# Patient Record
Sex: Male | Born: 1940 | Hispanic: No | Marital: Married | Smoking: Former smoker
Health system: Southern US, Community
[De-identification: ages and names within clinical notes are randomized; demographics above are authoritative.]

## PROBLEM LIST (undated history)

## (undated) DIAGNOSIS — E7800 Pure hypercholesterolemia, unspecified: Secondary | ICD-10-CM

## (undated) DIAGNOSIS — I10 Essential (primary) hypertension: Secondary | ICD-10-CM

## (undated) DIAGNOSIS — I999 Unspecified disorder of circulatory system: Secondary | ICD-10-CM

## (undated) HISTORY — PX: COLONOSCOPY: SHX174

## (undated) HISTORY — DX: Unspecified disorder of circulatory system: I99.9

## (undated) HISTORY — PX: TOOTH EXTRACTION: SUR596

---

## 2006-10-27 ENCOUNTER — Ambulatory Visit: Payer: Self-pay | Admitting: Internal Medicine

## 2006-12-06 ENCOUNTER — Ambulatory Visit: Payer: Self-pay | Admitting: Internal Medicine

## 2011-12-04 ENCOUNTER — Encounter: Payer: Self-pay | Admitting: Internal Medicine

## 2012-08-15 ENCOUNTER — Encounter: Payer: Self-pay | Admitting: Internal Medicine

## 2014-08-27 ENCOUNTER — Encounter: Payer: Self-pay | Admitting: Physician Assistant

## 2014-08-27 ENCOUNTER — Ambulatory Visit (INDEPENDENT_AMBULATORY_CARE_PROVIDER_SITE_OTHER): Payer: Medicare Other | Admitting: Physician Assistant

## 2014-08-27 VITALS — BP 172/67 | HR 59 | Temp 98.5°F | Resp 16 | Ht 62.5 in | Wt 132.2 lb

## 2014-08-27 DIAGNOSIS — I10 Essential (primary) hypertension: Secondary | ICD-10-CM | POA: Insufficient documentation

## 2014-08-27 MED ORDER — LISINOPRIL 10 MG PO TABS: 10.0000 mg | ORAL_TABLET | Freq: Every day | ORAL | Status: DC

## 2014-08-27 NOTE — Patient Instructions (Signed)
Please return in 2 weeks for follow-up and Annual exam with fasting labs.  We can help finish getting you the immunizations you are due for at that time.  Please take lisinopril as directed.  Headaches should have improved.  Follow-up in 2 weeks.  DASH Eating Plan DASH stands for "Dietary Approaches to Stop Hypertension." The DASH eating plan is a healthy eating plan that has been shown to reduce high blood pressure (hypertension). Additional health benefits may include reducing the risk of type 2 diabetes mellitus, heart disease, and stroke. The DASH eating plan may also help with weight loss. WHAT DO I NEED TO KNOW ABOUT THE DASH EATING PLAN? For the DASH eating plan, you will follow these general guidelines:  Choose foods with a percent daily value for sodium of less than 5% (as listed on the food label).  Use salt-free seasonings or herbs instead of table salt or sea salt.  Check with your health care provider or pharmacist before using salt substitutes.  Eat lower-sodium products, often labeled as "lower sodium" or "no salt added."  Eat fresh foods.  Eat more vegetables, fruits, and low-fat dairy products.  Choose whole grains. Look for the word "whole" as the first word in the ingredient list.  Choose fish and skinless chicken or [place] more often than red meat. Limit fish, poultry, and meat to 6 oz (170 g) each day.  Limit sweets, desserts, sugars, and sugary drinks.  Choose heart-healthy fats.  Limit cheese to 1 oz (28 g) per day.  Eat more home-cooked food and less restaurant, buffet, and fast food.  Limit fried foods.  Cook foods using methods other than frying.  Limit canned vegetables. If you do use them, rinse them well to decrease the sodium.  When eating at a restaurant, ask that your food be prepared with less salt, or no salt if possible. WHAT FOODS CAN I EAT? Seek help from a dietitian for individual calorie needs. Grains Whole grain or whole wheat bread.  Brown rice. Whole grain or whole wheat pasta. Quinoa, bulgur, and whole grain cereals. Low-sodium cereals. Corn or whole wheat flour tortillas. Whole grain cornbread. Whole grain crackers. Low-sodium crackers. Vegetables Fresh or frozen vegetables (raw, steamed, roasted, or grilled). Low-sodium or reduced-sodium tomato and vegetable juices. Low-sodium or reduced-sodium tomato sauce and paste. Low-sodium or reduced-sodium canned vegetables.  Fruits All fresh, canned (in natural juice), or frozen fruits. Meat and Other Protein Products Ground beef (85% or leaner), grass-fed beef, or beef trimmed of fat. Skinless chicken or [place]. Ground chicken or [place]. Pork trimmed of fat. All fish and seafood. Eggs. Dried beans, peas, or lentils. Unsalted nuts and seeds. Unsalted canned beans. Dairy Low-fat dairy products, such as skim or 1% milk, 2% or reduced-fat cheeses, low-fat ricotta or cottage cheese, or plain low-fat yogurt. Low-sodium or reduced-sodium cheeses. Fats and Oils Tub margarines without trans fats. Light or reduced-fat mayonnaise and salad dressings (reduced sodium). Avocado. Safflower, olive, or canola oils. Natural peanut or almond butter. Other Unsalted popcorn and pretzels. The items listed above may not be a complete list of recommended foods or beverages. Contact your dietitian for more options. WHAT FOODS ARE NOT RECOMMENDED? Grains White bread. White pasta. White rice. Refined cornbread. Bagels and croissants. Crackers that contain trans fat. Vegetables Creamed or fried vegetables. Vegetables in a cheese sauce. Regular canned vegetables. Regular canned tomato sauce and paste. Regular tomato and vegetable juices. Fruits Dried fruits. Canned fruit in light or heavy syrup. Fruit juice. Meat and  Other Protein Products Fatty cuts of meat. Ribs, chicken wings, bacon, sausage, bologna, salami, chitterlings, fatback, hot dogs, bratwurst, and packaged luncheon meats. Salted nuts and seeds.  Canned beans with salt. Dairy Whole or 2% milk, cream, half-and-half, and cream cheese. Whole-fat or sweetened yogurt. Full-fat cheeses or blue cheese. Nondairy creamers and whipped toppings. Processed cheese, cheese spreads, or cheese curds. Condiments Onion and garlic salt, seasoned salt, table salt, and sea salt. Canned and packaged gravies. Worcestershire sauce. Tartar sauce. Barbecue sauce. Teriyaki sauce. Soy sauce, including reduced sodium. Steak sauce. Fish sauce. Oyster sauce. Cocktail sauce. Horseradish. Ketchup and mustard. Meat flavorings and tenderizers. Bouillon cubes. Hot sauce. Tabasco sauce. Marinades. Taco seasonings. Relishes. Fats and Oils Butter, stick margarine, lard, shortening, ghee, and bacon fat. Coconut, palm kernel, or palm oils. Regular salad dressings. Other Pickles and olives. Salted popcorn and pretzels. The items listed above may not be a complete list of foods and beverages to avoid. Contact your dietitian for more information. WHERE CAN I FIND MORE INFORMATION? National Heart, Lung, and Blood Institute: [employer] Document Released: 10/15/2011 Document Revised: 03/12/2014 Document Reviewed: 08/30/2013 [facility] Patient Information 2015 [place], [place]. This information is not intended to replace advice given to you by your health care provider. Make sure you discuss any questions you have with your health care provider.  Hypertension Hypertension, commonly called high blood pressure, is when the force of blood pumping through your arteries is too strong. Your arteries are the blood vessels that carry blood from your heart throughout your body. A blood pressure reading consists of a higher number over a lower number, such as 110/72. The higher number (systolic) is the pressure inside your arteries when your heart pumps. The lower number (diastolic) is the pressure inside your arteries when your heart relaxes. Ideally you want  your blood pressure below 120/80. Hypertension forces your heart to work harder to pump blood. Your arteries may become narrow or stiff. Having hypertension puts you at risk for heart disease, stroke, and other problems.  RISK FACTORS Some risk factors for high blood pressure are controllable. Others are not.  Risk factors you cannot control include:   Race. You may be at higher risk if you are African American.  Age. Risk increases with age.  Gender. Men are at higher risk than women before age 104 years. After age 62, women are at higher risk than men. Risk factors you can control include:  Not getting enough exercise or physical activity.  Being overweight.  Getting too much fat, sugar, calories, or salt in your diet.  Drinking too much alcohol. SIGNS AND SYMPTOMS Hypertension does not usually cause signs or symptoms. Extremely high blood pressure (hypertensive crisis) may cause headache, anxiety, shortness of breath, and nosebleed. DIAGNOSIS  To check if you have hypertension, your health care provider will measure your blood pressure while you are seated, with your arm held at the level of your heart. It should be measured at least twice using the same arm. Certain conditions can cause a difference in blood pressure between your right and left arms. A blood pressure reading that is higher than normal on one occasion does not mean that you need treatment. If one blood pressure reading is high, ask your health care provider about having it checked again. TREATMENT  Treating high blood pressure includes making lifestyle changes and possibly taking medicine. Living a healthy lifestyle can help lower high blood pressure. You may need to change some of your habits. Lifestyle changes may include:  Following the DASH diet. This diet is high in fruits, vegetables, and whole grains. It is low in salt, red meat, and added sugars.  Getting at least 2 hours of brisk physical activity every  week.  Losing weight if necessary.  Not smoking.  Limiting alcoholic beverages.  Learning ways to reduce stress. If lifestyle changes are not enough to get your blood pressure under control, your health care provider may prescribe medicine. You may need to take more than one. Work closely with your health care provider to understand the risks and benefits. HOME CARE INSTRUCTIONS  Have your blood pressure rechecked as directed by your health care provider.   Take medicines only as directed by your health care provider. Follow the directions carefully. Blood pressure medicines must be taken as prescribed. The medicine does not work as well when you skip doses. Skipping doses also puts you at risk for problems.   Do not smoke.   Monitor your blood pressure at home as directed by your health care provider. SEEK MEDICAL CARE IF:   You think you are having a reaction to medicines taken.  You have recurrent headaches or feel dizzy.  You have swelling in your ankles.  You have trouble with your vision. SEEK IMMEDIATE MEDICAL CARE IF:  You develop a severe headache or confusion.  You have unusual weakness, numbness, or feel faint.  You have severe chest or abdominal pain.  You vomit repeatedly.  You have trouble breathing. MAKE SURE YOU:   Understand these instructions.  Will watch your condition.  Will get help right away if you are not doing well or get worse. Document Released: 10/26/2005 Document Revised: 03/12/2014 Document Reviewed: 08/18/2013 [facility] Patient Information 2015 [place], [place]. This information is not intended to replace advice given to you by your health care provider. Make sure you discuss any questions you have with your health care provider.

## 2014-08-27 NOTE — Assessment & Plan Note (Signed)
New onset per patient.  EKG reveals sinus bradycardia.  Asymptomatic at present. Rx Lisinopril 10 mg. DASH handout given.  Follow-up in 2 weeks for BP recheck and CPE.

## 2014-08-27 NOTE — Progress Notes (Signed)
Pre visit review using our clinic review tool, if applicable. No additional management support is needed unless otherwise documented below in the visit note/SLS  

## 2014-08-27 NOTE — Progress Notes (Signed)
Patient presents to clinic today with translator to establish care.    Acute Concerns: Patient endorses intermittent headaches and some mild fatigue over the past 1-2 months.  Denies history of hypertension but BP elevated at 172/67 in clinic.  Denies chest pain, palpitations, vision change or nose bleeds.  Denies history of MI, CVA, CAD, HLD or DM.  States he is seen yearly by a doctor.  Chronic Issues: Denies significant history of HTN.  Health Maintenance: Dental -- Edentulous. Vision -- up-to-date.  Was told he has cataracts. Immunizations -- Influenza shot given today. Colonoscopy -- Last in 2010.  Endorses was normal.  Past Medical History  Diagnosis Date  . Vascular abnormality     Treated    Past Surgical History  Procedure Laterality Date  . Tooth extraction      Full Dentures    No current outpatient prescriptions on file prior to visit.   No current facility-administered medications on file prior to visit.    No Known Allergies  Family History  Problem Relation Age of Onset  . Healthy Mother     Living  . Healthy Father     Living  . Healthy Brother     x5  . Healthy Sister     x1  . Healthy Son     x2  . Healthy Daughter     x1    History   Social History  . Marital Status: Married    Spouse Name: N/A    Number of Children: N/A  . Years of Education: N/A   Occupational History  . Not on file.   Social History Main Topics  . Smoking status: Former Games developermoker  . Smokeless tobacco: Never Used     Comment: Quit >10 yrs ago  . Alcohol Use: Yes     Comment: rare  . Drug Use: No  . Sexual Activity: No   Other Topics Concern  . Not on file   Social History Narrative  . No narrative on file   Review of Systems  Constitutional: Negative for fever and weight loss.  HENT: Negative for ear discharge, ear pain, hearing loss and tinnitus.   Eyes: Negative for blurred vision, double vision, photophobia and pain.  Respiratory: Negative for  cough and shortness of breath.   Cardiovascular: Negative for chest pain and palpitations.  Gastrointestinal: Negative for heartburn, nausea, vomiting, abdominal pain, diarrhea, constipation, blood in stool and melena.  Genitourinary: Negative for dysuria, urgency, frequency, hematuria and flank pain.  Neurological: Positive for headaches. Negative for dizziness and loss of consciousness.  Endo/Heme/Allergies: Negative for environmental allergies.  Psychiatric/Behavioral: Negative for depression, suicidal ideas, hallucinations and substance abuse. The patient is not nervous/anxious and does not have insomnia.    BP 172/67  Pulse 59  Temp(Src) 98.5 F (36.9 C) (Oral)  Resp 16  Ht 5' 2.5" (1.588 m)  Wt 132 lb 4 oz (59.988 kg)  BMI 23.79 kg/m2  SpO2 98%  Physical Exam  Vitals reviewed. Constitutional: He is oriented to person, place, and time and well-developed, well-nourished, and in no distress.  HENT:  Head: Normocephalic and atraumatic.  Right Ear: External ear normal.  Left Ear: External ear normal.  Nose: Nose normal.  Mouth/Throat: Oropharynx is clear and moist. No oropharyngeal exudate.  TM within normal limits bilaterally.  Eyes: Conjunctivae are normal. Pupils are equal, round, and reactive to light.  Neck: Neck supple.  Cardiovascular: Normal rate, regular rhythm, normal heart sounds and intact distal pulses.  Pulmonary/Chest: Effort normal and breath sounds normal. No respiratory distress. He has no wheezes. He has no rales. He exhibits no tenderness.  Lymphadenopathy:    He has no cervical adenopathy.  Neurological: He is alert and oriented to person, place, and time.  Skin: Skin is warm and dry. No rash noted.  Psychiatric: Affect normal.   Assessment/Plan: Essential hypertension New onset per patient.  EKG reveals sinus bradycardia.  Asymptomatic at present. Rx Lisinopril 10 mg. DASH handout given.  Follow-up in 2 weeks for BP recheck and CPE.

## 2014-09-10 ENCOUNTER — Encounter: Payer: Self-pay | Admitting: Physician Assistant

## 2014-09-10 ENCOUNTER — Ambulatory Visit (INDEPENDENT_AMBULATORY_CARE_PROVIDER_SITE_OTHER): Payer: Medicare Other | Admitting: Physician Assistant

## 2014-09-10 VITALS — BP 155/63 | HR 61 | Temp 98.0°F | Resp 16 | Ht 62.5 in | Wt 133.4 lb

## 2014-09-10 DIAGNOSIS — I10 Essential (primary) hypertension: Secondary | ICD-10-CM

## 2014-09-10 DIAGNOSIS — Z0000 Encounter for general adult medical examination without abnormal findings: Secondary | ICD-10-CM

## 2014-09-10 DIAGNOSIS — Z125 Encounter for screening for malignant neoplasm of prostate: Secondary | ICD-10-CM

## 2014-09-10 DIAGNOSIS — J208 Acute bronchitis due to other specified organisms: Secondary | ICD-10-CM

## 2014-09-10 DIAGNOSIS — B9689 Other specified bacterial agents as the cause of diseases classified elsewhere: Secondary | ICD-10-CM

## 2014-09-10 DIAGNOSIS — J00 Acute nasopharyngitis [common cold]: Secondary | ICD-10-CM

## 2014-09-10 DIAGNOSIS — E785 Hyperlipidemia, unspecified: Secondary | ICD-10-CM

## 2014-09-10 DIAGNOSIS — H269 Unspecified cataract: Secondary | ICD-10-CM

## 2014-09-10 LAB — LIPID PANEL
CHOLESTEROL: 207 mg/dL — AB (ref 0–200)
HDL: 43.7 mg/dL (ref >39.00)
LDL Cholesterol: 134 mg/dL — ABNORMAL HIGH (ref 0–99)
NonHDL: 163.3
Total CHOL/HDL Ratio: 5
Triglycerides: 145 mg/dL (ref 0.0–149.0)
VLDL: 29 mg/dL (ref 0.0–40.0)

## 2014-09-10 LAB — BASIC METABOLIC PANEL
BUN: 15 mg/dL (ref 6–23)
CHLORIDE: 104 meq/L (ref 96–112)
CO2: 24 meq/L (ref 19–32)
CREATININE: 1 mg/dL (ref 0.4–1.5)
Calcium: 9.3 mg/dL (ref 8.4–10.5)
GFR: 76.94 mL/min (ref >60.00)
GLUCOSE: 93 mg/dL (ref 70–99)
Potassium: 3.9 mEq/L (ref 3.5–5.1)
Sodium: 137 mEq/L (ref 135–145)

## 2014-09-10 LAB — HEPATIC FUNCTION PANEL
ALT: 18 U/L (ref 0–53)
AST: 26 U/L (ref 0–37)
Albumin: 3.9 g/dL (ref 3.5–5.2)
Alkaline Phosphatase: 45 U/L (ref 39–117)
Bilirubin, Direct: 0.2 mg/dL (ref 0.0–0.3)
TOTAL PROTEIN: 7.6 g/dL (ref 6.0–8.3)
Total Bilirubin: 0.8 mg/dL (ref 0.2–1.2)

## 2014-09-10 LAB — PSA: PSA: 1.66 ng/mL (ref 0.10–4.00)

## 2014-09-10 MED ORDER — AZITHROMYCIN 250 MG PO TABS: ORAL_TABLET | ORAL | Status: DC

## 2014-09-10 MED ORDER — LOSARTAN POTASSIUM 100 MG PO TABS: 100.0000 mg | ORAL_TABLET | Freq: Every day | ORAL | Status: DC

## 2014-09-10 NOTE — Patient Instructions (Signed)
For the cough, please take antibiotic as directed.  Increase your fluid intake.  Rest.  Use Delsym for cough.  Call or return to clinic if symptoms are not improving.  For blood pressure, giving the cough I am switching your medication to losartan 100 mg daily.  Take this as directed.  Follow-up in 2 weeks for a BP check.  Please stop by lab for blood work.  I will call you with your results.  We will begin medications if indicated by results.  Acute Bronchitis Bronchitis is inflammation of the airways that extend from the windpipe into the lungs (bronchi). The inflammation often causes mucus to develop. This leads to a cough, which is the most common symptom of bronchitis.  In acute bronchitis, the condition usually develops suddenly and goes away over time, usually in a couple weeks. Smoking, allergies, and asthma can make bronchitis worse. Repeated episodes of bronchitis may cause further lung problems.  CAUSES Acute bronchitis is most often caused by the same virus that causes a cold. The virus can spread from person to person (contagious) through coughing, sneezing, and touching contaminated objects. SIGNS AND SYMPTOMS   Cough.   Fever.   Coughing up mucus.   Body aches.   Chest congestion.   Chills.   Shortness of breath.   Sore throat.  DIAGNOSIS  Acute bronchitis is usually diagnosed through a physical exam. Your health care provider will also ask you questions about your medical history. Tests, such as chest X-rays, are sometimes done to rule out other conditions.  TREATMENT  Acute bronchitis usually goes away in a couple weeks. Oftentimes, no medical treatment is necessary. Medicines are sometimes given for relief of fever or cough. Antibiotic medicines are usually not needed but may be prescribed in certain situations. In some cases, an inhaler may be recommended to help reduce shortness of breath and control the cough. A cool mist vaporizer may also be used to help  thin bronchial secretions and make it easier to clear the chest.  HOME CARE INSTRUCTIONS  Get plenty of rest.   Drink enough fluids to keep your urine clear or pale yellow (unless you have a medical condition that requires fluid restriction). Increasing fluids may help thin your respiratory secretions (sputum) and reduce chest congestion, and it will prevent dehydration.   Take medicines only as directed by your health care provider.  If you were prescribed an antibiotic medicine, finish it all even if you start to feel better.  Avoid smoking and secondhand smoke. Exposure to cigarette smoke or irritating chemicals will make bronchitis worse. If you are a smoker, consider using nicotine gum or skin patches to help control withdrawal symptoms. Quitting smoking will help your lungs heal faster.   Reduce the chances of another bout of acute bronchitis by washing your hands frequently, avoiding people with cold symptoms, and trying not to touch your hands to your mouth, nose, or eyes.   Keep all follow-up visits as directed by your health care provider.  SEEK MEDICAL CARE IF: Your symptoms do not improve after 1 week of treatment.  SEEK IMMEDIATE MEDICAL CARE IF:  You develop an increased fever or chills.   You have chest pain.   You have severe shortness of breath.  You have bloody sputum.   You develop dehydration.  You faint or repeatedly feel like you are going to pass out.  You develop repeated vomiting.  You develop a severe headache. MAKE SURE YOU:   Understand these instructions.  Will watch your condition.  Will get help right away if you are not doing well or get worse. Document Released: 12/03/2004 Document Revised: 03/12/2014 Document Reviewed: 04/18/2013 [facility] Patient Information 2015 [place], [place]. This information is not intended to replace advice given to you by your health care provider. Make sure you discuss any questions you have with your health  care provider.

## 2014-09-10 NOTE — Progress Notes (Signed)
Pre visit review using our clinic review tool, if applicable. No additional management support is needed unless otherwise documented below in the visit note/SLS  

## 2014-09-10 NOTE — Progress Notes (Signed)
Subjective:    Mariel AloeLuong Kleckner is a 73 y.o. male who presents for Medicare Annual/Subsequent preventive examination.   Preventive Screening-Counseling & Management  Tobacco History  Smoking status  . Former Smoker  Smokeless tobacco  . Never Used    Comment: Quit >10 yrs ago    Current Problems (verified) Patient Active Problem List   Diagnosis Date Noted  . Cataracts, bilateral 09/11/2014  . Routine history and physical examination of adult 09/11/2014  . Screening for prostate cancer 09/11/2014  . Screening for ischemic heart disease 09/11/2014  . Acute bacterial bronchitis 09/11/2014  . Essential hypertension 08/27/2014    Medications Prior to Visit Current Outpatient Prescriptions on File Prior to Visit  Medication Sig Dispense Refill  . Multiple Vitamin (MULTIVITAMIN) tablet Take 1 tablet by mouth daily.    Marland Kitchen. OVER THE COUNTER MEDICATION as needed. Cough Medication     No current facility-administered medications on file prior to visit.    Current Medications (verified) Current Outpatient Prescriptions  Medication Sig Dispense Refill  . azithromycin (ZITHROMAX Z-PAK) 250 MG tablet Take 2 tablets on Day 1.  Then take 1 tablet daily. 6 tablet 0  . losartan (COZAAR) 100 MG tablet Take 1 tablet (100 mg total) by mouth daily. 90 tablet 3  . Multiple Vitamin (MULTIVITAMIN) tablet Take 1 tablet by mouth daily.    Marland Kitchen. OVER THE COUNTER MEDICATION as needed. Cough Medication     No current facility-administered medications for this visit.     Allergies (verified) Review of patient's allergies indicates no known allergies.   PAST HISTORY  Family History Family History  Problem Relation Age of Onset  . Healthy Mother     Living  . Healthy Father     Living  . Healthy Brother     x5  . Healthy Sister     x1  . Healthy Son     x2  . Healthy Daughter     x1    Social History History  Substance Use Topics  . Smoking status: Former Games developermoker  . Smokeless tobacco: Never  Used     Comment: Quit >10 yrs ago  . Alcohol Use: Yes     Comment: rare   Are there smokers in your home (other than you)?  No  Risk Factors Current exercise habits: Home exercise routine includes walking 1 hrs per day.  Dietary issues discussed: well-balanced diet   Cardiac risk factors: advanced age (older than 2555 for men, 8465 for women) and hypertension.  Depression Screen (Note: if answer to either of the following is "Yes", a more complete depression screening is indicated)   Q1: Over the past two weeks, have you felt down, depressed or hopeless? No  Q2: Over the past two weeks, have you felt little interest or pleasure in doing things? No  Have you lost interest or pleasure in daily life? No  Do you often feel hopeless? No  Do you cry easily over simple problems? No  Activities of Daily Living In your present state of health, do you have any difficulty performing the following activities?:  Driving? No Managing money?  No Feeding yourself? No Getting from bed to chair? No Climbing a flight of stairs? No Preparing food and eating?: No Bathing or showering? No Getting dressed: No Getting to the toilet? No Using the toilet:No Moving around from place to place: No In the past year have you fallen or had a near fall?:No   Are you sexually active?  No  Do you have more than one partner?  No  Hearing Difficulties: No Do you often ask people to speak up or repeat themselves? No Do you experience ringing or noises in your ears? No Do you have difficulty understanding soft or whispered voices? No   Do you feel that you have a problem with memory? No  Do you often misplace items? No  Do you feel safe at home?  Yes  Cognitive Testing  Alert? Yes  Normal Appearance?Yes  Oriented to person? Yes  Place? Yes   Time? Yes  Recall of three objects?  Yes  Can perform simple calculations? Yes  Displays appropriate judgment?Yes  Can read the correct time from a watch  face?Yes   Advanced Directives have been discussed with the patient? No   List the Names of Other Physician/Practitioners you currently use: Patient denies other practitioners.  Indicate any recent Medical Services you may have received from other than [facility] providers in the past year (date may be approximate).   There is no immunization history on file for this patient.  Screening Tests Health Maintenance  Topic Date Due  . TETANUS/TDAP  08/01/1960  . ZOSTAVAX  08/01/2001  . PNEUMOCOCCAL POLYSACCHARIDE VACCINE AGE 20 AND OVER  08/01/2006  . INFLUENZA VACCINE  06/10/2015  . COLONOSCOPY  08/10/2019    All answers were reviewed with the patient and necessary referrals were made:  Piedad Climes, PA-C   09/11/2014   History reviewed: allergies, current medications, past family history, past medical history, past social history, past surgical history and problem list  Review of Systems A comprehensive review of systems was negative except for: Ears, nose, mouth, throat, and face: positive for nasal congestion and sore throat Respiratory: positive for cough     Acute Concerns: Patient c/o cough and chest congestion x 1 month.  Denies fever, chills, SOB, pleuritic chest pain, recent travel or sick contact.  Has not taken anything for symptoms.  Patient's BP is still mildly elevated despite take antihypertensive medications as directed.  Still denies chest pain, palpitations, lightheadedness or dizziness.    BP Readings from Last 3 Encounters:  09/10/14 155/63  08/27/14 172/67   Patient also requesting referral to Ophthalmology for cataract removal.   Objective:     Vision by Snellen chart: right eye:20/20, left eye:20/25 Blood pressure 155/63, pulse 61, temperature 98 F (36.7 C), temperature source Oral, resp. rate 16, height 5' 2.5" (1.588 m), weight 133 lb 6 oz (60.499 kg), SpO2 100 %. Body mass index is 23.99 kg/(m^2).  BP 155/63 mmHg  Pulse 61  Temp(Src) 98  F (36.7 C) (Oral)  Resp 16  Ht 5' 2.5" (1.588 m)  Wt 133 lb 6 oz (60.499 kg)  BMI 23.99 kg/m2  SpO2 100%  General Appearance:    Alert, cooperative, no distress, appears stated age  Head:    Normocephalic, without obvious abnormality, atraumatic  Eyes:    PERRL, conjunctiva/corneas clear, EOM's intact, fundi    benign, both eyes       Ears:    Normal TM's and external ear canals, both ears  Nose:   Nares normal, septum midline, mucosa normal, no drainage    or sinus tenderness  Throat:   Lips, mucosa, and tongue normal; teeth and gums normal  Neck:   Supple, symmetrical, trachea midline, no adenopathy;       thyroid:  No enlargement/tenderness/nodules; no carotid   bruit or JVD  Back:     Symmetric, no curvature, ROM  normal, no CVA tenderness  Lungs:     Clear to auscultation bilaterally, respirations unlabored  Chest wall:    No tenderness or deformity  Heart:    Regular rate and rhythm, S1 and S2 normal, no murmur, rub   or gallop  Abdomen:     Soft, non-tender, bowel sounds active all four quadrants,    no masses, no organomegaly  Genitalia:    Normal male without lesion, discharge or tenderness  Rectal:    Normal tone, normal prostate, no masses or tenderness;   guaiac negative stool  Extremities:   Extremities normal, atraumatic, no cyanosis or edema  Pulses:   2+ and symmetric all extremities  Skin:   Skin color, texture, turgor normal, no rashes or lesions  Lymph nodes:   Cervical, supraclavicular, and axillary nodes normal  Neurologic:   CNII-XII intact. Normal strength, sensation and reflexes      throughout       Assessment:    (1) Medicare Annual Wellness  (2) Hypertension  (3) Cataracts  (4) Acute Bacterial Bronchitis  (5) Non-specified Skin Lesion   (6) Prostate Cancer Screening   Plan:    (1) During the course of the visit the patient was educated and counseled about appropriate screening and preventive services including:    Prostate cancer  screening  Colorectal cancer screening  Nutrition counseling   Advanced directives: has NO advanced directive - not interested in additional information  Patient Instructions (the written plan) was given to the patient.  Medicare Attestation I have personally reviewed: The patient's medical and social history Their use of alcohol, tobacco or illicit drugs Their current medications and supplements The patient's functional ability including ADLs,fall risks, home safety risks, cognitive, and hearing and visual impairment Diet and physical activities Evidence for depression or mood disorders  The patient's weight, height, BMI, and visual acuity have been recorded in the chart.  I have made referrals, counseling, and provided education to the patient based on review of the above and I have provided the patient with a written personalized care plan for preventive services.    (2) Will change to Losartan giving cough and still mildly elevated BP.  DASH diet given.  Will follow-up in 2 weeks for repeat BP assessment  (3) Referral to Ophthalmology Placed  (4) Rx Azithromycin.  Increase fluids.  Rest.  Delsym for cough.  Probiotic.  Plain Mucinex if needed.  Follow-up in 2 weeks.  (5) Concern for malignancy giving appearance and location.  Referral placed to Dermatology for biopsy.  (6) Will obtain PSA today.  Marcelline MatesMartin, Milicent Acheampong [place], [place] JerseyPA-C   09/11/2014

## 2014-09-11 DIAGNOSIS — B9689 Other specified bacterial agents as the cause of diseases classified elsewhere: Secondary | ICD-10-CM | POA: Insufficient documentation

## 2014-09-11 DIAGNOSIS — Z0000 Encounter for general adult medical examination without abnormal findings: Secondary | ICD-10-CM | POA: Insufficient documentation

## 2014-09-11 DIAGNOSIS — Z125 Encounter for screening for malignant neoplasm of prostate: Secondary | ICD-10-CM | POA: Insufficient documentation

## 2014-09-11 DIAGNOSIS — Z136 Encounter for screening for cardiovascular disorders: Secondary | ICD-10-CM | POA: Insufficient documentation

## 2014-09-11 DIAGNOSIS — H269 Unspecified cataract: Secondary | ICD-10-CM | POA: Insufficient documentation

## 2014-09-11 DIAGNOSIS — J208 Acute bronchitis due to other specified organisms: Secondary | ICD-10-CM

## 2014-09-11 NOTE — Addendum Note (Signed)
Addended by: Eustace QuailEABOLD, Siearra Amberg J on: 09/11/2014 10:10 AM   Modules accepted: Orders

## 2014-09-24 ENCOUNTER — Encounter: Payer: Self-pay | Admitting: Physician Assistant

## 2014-09-24 ENCOUNTER — Ambulatory Visit (INDEPENDENT_AMBULATORY_CARE_PROVIDER_SITE_OTHER): Payer: Medicare Other | Admitting: Physician Assistant

## 2014-09-24 VITALS — BP 161/69 | HR 61 | Temp 98.2°F | Resp 14 | Ht 62.5 in | Wt 133.0 lb

## 2014-09-24 DIAGNOSIS — I10 Essential (primary) hypertension: Secondary | ICD-10-CM

## 2014-09-24 DIAGNOSIS — L989 Disorder of the skin and subcutaneous tissue, unspecified: Secondary | ICD-10-CM

## 2014-09-24 MED ORDER — HYDROCHLOROTHIAZIDE 12.5 MG PO TABS: 12.5000 mg | ORAL_TABLET | Freq: Every day | ORAL | Status: DC

## 2014-09-24 NOTE — Assessment & Plan Note (Signed)
Cough resolved.  Still above goal.  Will add HCTZ 12.5 mg PO QD. Limit salt intake.  Follow-up 1-2 months.

## 2014-09-24 NOTE — Progress Notes (Signed)
Pre visit review using our clinic review tool, if applicable. No additional management support is needed unless otherwise documented below in the visit note/SLS  

## 2014-09-24 NOTE — Patient Instructions (Signed)
Please take both the losartan and the HCTZ daily as directed.  Continue limiting salt intake.  Follow-up with me in 1-2 months for repeat BP check.   Hypertension Hypertension is another name for high blood pressure. High blood pressure forces your heart to work harder to pump blood. A blood pressure reading has two numbers, which includes a higher number over a lower number (example: 110/72). HOME CARE   Have your blood pressure rechecked by your doctor.  Only take medicine as told by your doctor. Follow the directions carefully. The medicine does not work as well if you skip doses. Skipping doses also puts you at risk for problems.  Do not smoke.  Monitor your blood pressure at home as told by your doctor. GET HELP IF:  You think you are having a reaction to the medicine you are taking.  You have repeat headaches or feel dizzy.  You have puffiness (swelling) in your ankles.  You have trouble with your vision. GET HELP RIGHT AWAY IF:   You get a very bad headache and are confused.  You feel weak, numb, or faint.  You get chest or belly (abdominal) pain.  You throw up (vomit).  You cannot breathe very well. MAKE SURE YOU:   Understand these instructions.  Will watch your condition.  Will get help right away if you are not doing well or get worse. Document Released: 04/13/2008 Document Revised: 10/31/2013 Document Reviewed: 08/18/2013 [facility] Patient Information 2015 [place], [place]. This information is not intended to replace advice given to you by your health care provider. Make sure you discuss any questions you have with your health care provider.

## 2014-09-24 NOTE — Progress Notes (Signed)
Patient presents to clinic today for follow-up of Hypertension. Patient switched to losartan 100 mg daily at last visit.  BP asymptomatic. Patient endorses taking medication daily as directed.  BP 156/76 in clinic on manual check.  Denies new concerns.  Still waiting on Dermatology to call him.   Past Medical History  Diagnosis Date  . Vascular abnormality     Treated    Current Outpatient Prescriptions on File Prior to Visit  Medication Sig Dispense Refill  . losartan (COZAAR) 100 MG tablet Take 1 tablet (100 mg total) by mouth daily. 90 tablet 3  . Multiple Vitamin (MULTIVITAMIN) tablet Take 1 tablet by mouth daily.    Marland Kitchen. OVER THE COUNTER MEDICATION as needed. Cough Medication     No current facility-administered medications on file prior to visit.    No Known Allergies  Family History  Problem Relation Age of Onset  . Healthy Mother     Living  . Healthy Father     Living  . Healthy Brother     x5  . Healthy Sister     x1  . Healthy Son     x2  . Healthy Daughter     x1    History   Social History  . Marital Status: Married    Spouse Name: N/A    Number of Children: N/A  . Years of Education: N/A   Social History Main Topics  . Smoking status: Former Games developermoker  . Smokeless tobacco: Never Used     Comment: Quit >10 yrs ago  . Alcohol Use: Yes     Comment: rare  . Drug Use: No  . Sexual Activity: No   Other Topics Concern  . None   Social History Narrative    Review of Systems - See HPI.  All other ROS are negative.  BP 161/69 mmHg  Pulse 61  Temp(Src) 98.2 F (36.8 C) (Oral)  Resp 14  Ht 5' 2.5" (1.588 m)  Wt 133 lb (60.328 kg)  BMI 23.92 kg/m2  SpO2 98%  Physical Exam  Constitutional: He is oriented to person, place, and time and well-developed, well-nourished, and in no distress.  HENT:  Head: Normocephalic and atraumatic.  Eyes: Conjunctivae are normal.  Cardiovascular: Normal rate, regular rhythm, normal heart sounds and intact distal  pulses.   Pulmonary/Chest: Effort normal and breath sounds normal. No respiratory distress. He has no wheezes. He has no rales. He exhibits no tenderness.  Neurological: He is alert and oriented to person, place, and time.  Skin: Skin is warm. No rash noted.  Psychiatric: Affect normal.  Vitals reviewed.   Recent Results (from the past 2160 hour(s))  Basic metabolic panel     Status: None   Collection Time: 09/10/14  9:19 AM  Result Value Ref Range   Sodium 137 135 - 145 mEq/L   Potassium 3.9 3.5 - 5.1 mEq/L   Chloride 104 96 - 112 mEq/L   CO2 24 19 - 32 mEq/L   Glucose, Bld 93 70 - 99 mg/dL   BUN 15 6 - 23 mg/dL   Creatinine, Ser 1.0 0.4 - 1.5 mg/dL   Calcium 9.3 8.4 - 16.110.5 mg/dL   GFR 09.6076.94 >45.40>60.00 mL/min  Hepatic function panel     Status: None   Collection Time: 09/10/14  9:19 AM  Result Value Ref Range   Total Bilirubin 0.8 0.2 - 1.2 mg/dL   Bilirubin, Direct 0.2 0.0 - 0.3 mg/dL   Alkaline Phosphatase 45 39 -  117 U/L   AST 26 0 - 37 U/L   ALT 18 0 - 53 U/L   Total Protein 7.6 6.0 - 8.3 g/dL   Albumin 3.9 3.5 - 5.2 g/dL  Lipid panel     Status: Abnormal   Collection Time: 09/10/14  9:19 AM  Result Value Ref Range   Cholesterol 207 (H) 0 - 200 mg/dL    Comment: ATP III Classification       Desirable:  < 200 mg/dL               Borderline High:  200 - 239 mg/dL          High:  > = 562240 mg/dL   Triglycerides 130.8145.0 0.0 - 149.0 mg/dL    Comment: Normal:  <657<150 mg/dLBorderline High:  150 - 199 mg/dL   HDL 84.6943.70 >62.95>39.00 mg/dL   VLDL 28.429.0 0.0 - 13.240.0 mg/dL   LDL Cholesterol 440134 (H) 0 - 99 mg/dL   Total CHOL/HDL Ratio 5     Comment:                Men          Women1/2 Average Risk     3.4          3.3Average Risk          5.0          4.42X Average Risk          9.6          7.13X Average Risk          15.0          11.0                       NonHDL 163.30     Comment: NOTE:  Non-HDL goal should be 30 mg/dL higher than patient's LDL goal (i.e. LDL goal of < 70 mg/dL, would have  non-HDL goal of < 100 mg/dL)  PSA     Status: None   Collection Time: 09/10/14  9:19 AM  Result Value Ref Range   PSA 1.66 0.10 - 4.00 ng/mL    Assessment/Plan: Essential hypertension Cough resolved.  Still above goal.  Will add HCTZ 12.5 mg PO QD. Limit salt intake.  Follow-up 1-2 months.

## 2014-11-26 ENCOUNTER — Encounter: Payer: Self-pay | Admitting: Physician Assistant

## 2014-11-26 ENCOUNTER — Ambulatory Visit (INDEPENDENT_AMBULATORY_CARE_PROVIDER_SITE_OTHER): Payer: Medicare Other | Admitting: Physician Assistant

## 2014-11-26 VITALS — BP 158/63 | HR 60 | Temp 98.0°F | Resp 14 | Ht 62.5 in | Wt 135.0 lb

## 2014-11-26 DIAGNOSIS — I10 Essential (primary) hypertension: Secondary | ICD-10-CM

## 2014-11-26 MED ORDER — HYDROCHLOROTHIAZIDE 12.5 MG PO TABS: 12.5000 mg | ORAL_TABLET | Freq: Every day | ORAL | Status: DC

## 2014-11-26 NOTE — Progress Notes (Signed)
Pre visit review using our clinic review tool, if applicable. No additional management support is needed unless otherwise documented below in the visit note/SLS  

## 2014-11-26 NOTE — Patient Instructions (Signed)
Please continue medications as directed. I have sent a refill to you pharmacy. Talk to the pharmacist about the probiotics we discussed.  They will help you find them.  I am glad you are doing well.  Follow-up with me in 6 months.  Return sooner if needed.  Hypertension Hypertension is another name for high blood pressure. High blood pressure forces your heart to work harder to pump blood. A blood pressure reading has two numbers, which includes a higher number over a lower number (example: 110/72). HOME CARE   Have your blood pressure rechecked by your doctor.  Only take medicine as told by your doctor. Follow the directions carefully. The medicine does not work as well if you skip doses. Skipping doses also puts you at risk for problems.  Do not smoke.  Monitor your blood pressure at home as told by your doctor. GET HELP IF:  You think you are having a reaction to the medicine you are taking.  You have repeat headaches or feel dizzy.  You have puffiness (swelling) in your ankles.  You have trouble with your vision. GET HELP RIGHT AWAY IF:   You get a very bad headache and are confused.  You feel weak, numb, or faint.  You get chest or belly (abdominal) pain.  You throw up (vomit).  You cannot breathe very well. MAKE SURE YOU:   Understand these instructions.  Will watch your condition.  Will get help right away if you are not doing well or get worse. Document Released: 04/13/2008 Document Revised: 10/31/2013 Document Reviewed: 08/18/2013 [facility]Exit[facility] Patient Information 2015 [place], [place]LL[place]. This information is not intended to replace advice given to you by your health care provider. Make sure you discuss any questions you have with your health care provider.

## 2014-11-26 NOTE — Progress Notes (Signed)
Patient presents to clinic today for 48100-month follow-up of hypertension.  Patient currently on Losartan 100 mg and HCTZ 12.5 mg daily.  Endorses taking as directed.  BP at 158/63 today in clinic.  Patient denies chest pain, palpitations, lightheadedness, dizziness, vision changes or frequent headaches. Has been out of HCTZ for 2 weeks.  Past Medical History  Diagnosis Date  . Vascular abnormality     Treated    Current Outpatient Prescriptions on File Prior to Visit  Medication Sig Dispense Refill  . losartan (COZAAR) 100 MG tablet Take 1 tablet (100 mg total) by mouth daily. 90 tablet 3  . Multiple Vitamin (MULTIVITAMIN) tablet Take 1 tablet by mouth daily.    Marland Kitchen. OVER THE COUNTER MEDICATION as needed. Cough Medication     No current facility-administered medications on file prior to visit.    No Known Allergies  Family History  Problem Relation Age of Onset  . Healthy Mother     Living  . Healthy Father     Living  . Healthy Brother     x5  . Healthy Sister     x1  . Healthy Son     x2  . Healthy Daughter     x1    History   Social History  . Marital Status: Married    Spouse Name: N/A    Number of Children: N/A  . Years of Education: N/A   Social History Main Topics  . Smoking status: Former Games developermoker  . Smokeless tobacco: Never Used     Comment: Quit >10 yrs ago  . Alcohol Use: Yes     Comment: rare  . Drug Use: No  . Sexual Activity: No   Other Topics Concern  . None   Social History Narrative   Review of Systems - See HPI.  All other ROS are negative.  BP 158/63 mmHg  Pulse 60  Temp(Src) 98 F (36.7 C) (Oral)  Resp 14  Ht 5' 2.5" (1.588 m)  Wt 135 lb (61.236 kg)  BMI 24.28 kg/m2  SpO2 100%  Physical Exam  Constitutional: He is oriented to person, place, and time and well-developed, well-nourished, and in no distress.  HENT:  Head: Normocephalic and atraumatic.  Cardiovascular: Normal rate, regular rhythm, normal heart sounds and intact  distal pulses.   Pulmonary/Chest: Effort normal and breath sounds normal. He has no wheezes. He has no rales. He exhibits no tenderness.  Neurological: He is alert and oriented to person, place, and time.  Skin: Skin is warm and dry. No rash noted.  Psychiatric: Affect normal.  Vitals reviewed.   Recent Results (from the past 2160 hour(s))  Basic metabolic panel     Status: None   Collection Time: 09/10/14  9:19 AM  Result Value Ref Range   Sodium 137 135 - 145 mEq/L   Potassium 3.9 3.5 - 5.1 mEq/L   Chloride 104 96 - 112 mEq/L   CO2 24 19 - 32 mEq/L   Glucose, Bld 93 70 - 99 mg/dL   BUN 15 6 - 23 mg/dL   Creatinine, Ser 1.0 0.4 - 1.5 mg/dL   Calcium 9.3 8.4 - 16.110.5 mg/dL   GFR 09.6076.94 >45.40>60.00 mL/min  Hepatic function panel     Status: None   Collection Time: 09/10/14  9:19 AM  Result Value Ref Range   Total Bilirubin 0.8 0.2 - 1.2 mg/dL   Bilirubin, Direct 0.2 0.0 - 0.3 mg/dL   Alkaline Phosphatase 45 39 -  117 U/L   AST 26 0 - 37 U/L   ALT 18 0 - 53 U/L   Total Protein 7.6 6.0 - 8.3 g/dL   Albumin 3.9 3.5 - 5.2 g/dL  Lipid panel     Status: Abnormal   Collection Time: 09/10/14  9:19 AM  Result Value Ref Range   Cholesterol 207 (H) 0 - 200 mg/dL    Comment: ATP III Classification       Desirable:  < 200 mg/dL               Borderline High:  200 - 239 mg/dL          High:  > = 161 mg/dL   Triglycerides 096.0 0.0 - 149.0 mg/dL    Comment: Normal:  <454 mg/dLBorderline High:  150 - 199 mg/dL   HDL 09.81 >19.14 mg/dL   VLDL 78.2 0.0 - 95.6 mg/dL   LDL Cholesterol 213 (H) 0 - 99 mg/dL   Total CHOL/HDL Ratio 5     Comment:                Men          Women1/2 Average Risk     3.4          3.3Average Risk          5.0          4.42X Average Risk          9.6          7.13X Average Risk          15.0          11.0                       NonHDL 163.30     Comment: NOTE:  Non-HDL goal should be 30 mg/dL higher than patient's LDL goal (i.e. LDL goal of < 70 mg/dL, would have non-HDL goal  of < 100 mg/dL)  PSA     Status: None   Collection Time: 09/10/14  9:19 AM  Result Value Ref Range   PSA 1.66 0.10 - 4.00 ng/mL    Assessment/Plan: Essential hypertension Out of HCTZ.  Medications refilled.  Continue current regimen. Stay well hydrated but limit salt intake. Follow-up in 6 months.

## 2014-11-26 NOTE — Assessment & Plan Note (Signed)
Out of HCTZ.  Medications refilled.  Continue current regimen. Stay well hydrated but limit salt intake. Follow-up in 6 months.

## 2015-03-12 ENCOUNTER — Other Ambulatory Visit (INDEPENDENT_AMBULATORY_CARE_PROVIDER_SITE_OTHER): Payer: Medicare Other

## 2015-03-12 DIAGNOSIS — E785 Hyperlipidemia, unspecified: Secondary | ICD-10-CM

## 2015-03-12 LAB — LIPID PANEL
Cholesterol: 158 mg/dL (ref 0–200)
HDL: 37.7 mg/dL — ABNORMAL LOW (ref >39.00)
LDL Cholesterol: 90 mg/dL (ref 0–99)
NONHDL: 120.3
Total CHOL/HDL Ratio: 4
Triglycerides: 151 mg/dL — ABNORMAL HIGH (ref 0.0–149.0)
VLDL: 30.2 mg/dL (ref 0.0–40.0)

## 2015-05-27 ENCOUNTER — Ambulatory Visit: Payer: Medicare Other | Admitting: Physician Assistant

## 2015-05-27 ENCOUNTER — Ambulatory Visit (INDEPENDENT_AMBULATORY_CARE_PROVIDER_SITE_OTHER): Payer: Medicare Other | Admitting: Physician Assistant

## 2015-05-27 ENCOUNTER — Ambulatory Visit (HOSPITAL_BASED_OUTPATIENT_CLINIC_OR_DEPARTMENT_OTHER)
Admission: RE | Admit: 2015-05-27 | Discharge: 2015-05-27 | Disposition: A | Discharge status: Home / Self Care | Payer: Medicare Other | Source: Ambulatory Visit | Attending: Physician Assistant | Admitting: Physician Assistant

## 2015-05-27 ENCOUNTER — Encounter: Payer: Self-pay | Admitting: Physician Assistant

## 2015-05-27 VITALS — BP 118/60 | HR 61 | Temp 97.8°F | Ht 62.5 in | Wt 131.0 lb

## 2015-05-27 DIAGNOSIS — K598 Other specified functional intestinal disorders: Secondary | ICD-10-CM | POA: Diagnosis not present

## 2015-05-27 DIAGNOSIS — K5900 Constipation, unspecified: Secondary | ICD-10-CM | POA: Diagnosis not present

## 2015-05-27 DIAGNOSIS — I10 Essential (primary) hypertension: Secondary | ICD-10-CM

## 2015-05-27 DIAGNOSIS — R109 Unspecified abdominal pain: Secondary | ICD-10-CM | POA: Diagnosis not present

## 2015-05-27 LAB — CBC WITH DIFFERENTIAL/PLATELET
BASOS PCT: 0.4 % (ref 0.0–3.0)
Basophils Absolute: 0 10*3/uL (ref 0.0–0.1)
EOS ABS: 0.1 10*3/uL (ref 0.0–0.7)
EOS PCT: 1.5 % (ref 0.0–5.0)
HCT: 44.2 % (ref 39.0–52.0)
HEMOGLOBIN: 14.9 g/dL (ref 13.0–17.0)
Lymphocytes Relative: 31.6 % (ref 12.0–46.0)
Lymphs Abs: 2.1 10*3/uL (ref 0.7–4.0)
MCHC: 33.7 g/dL (ref 30.0–36.0)
MCV: 86.2 fl (ref 78.0–100.0)
MONOS PCT: 8.1 % (ref 3.0–12.0)
Monocytes Absolute: 0.6 10*3/uL (ref 0.1–1.0)
NEUTROS ABS: 4 10*3/uL (ref 1.4–7.7)
Neutrophils Relative %: 58.4 % (ref 43.0–77.0)
Platelets: 227 10*3/uL (ref 150.0–400.0)
RBC: 5.13 Mil/uL (ref 4.22–5.81)
RDW: 13.5 % (ref 11.5–15.5)
WBC: 6.8 10*3/uL (ref 4.0–10.5)

## 2015-05-27 LAB — COMPREHENSIVE METABOLIC PANEL
ALT: 20 U/L (ref 0–53)
AST: 27 U/L (ref 0–37)
Albumin: 4.7 g/dL (ref 3.5–5.2)
Alkaline Phosphatase: 44 U/L (ref 39–117)
BILIRUBIN TOTAL: 0.5 mg/dL (ref 0.2–1.2)
BUN: 23 mg/dL (ref 6–23)
CALCIUM: 9.8 mg/dL (ref 8.4–10.5)
CHLORIDE: 102 meq/L (ref 96–112)
CO2: 31 mEq/L (ref 19–32)
Creatinine, Ser: 1.04 mg/dL (ref 0.40–1.50)
GFR: 74.24 mL/min (ref >60.00)
Glucose, Bld: 98 mg/dL (ref 70–99)
Potassium: 3.9 mEq/L (ref 3.5–5.1)
SODIUM: 139 meq/L (ref 135–145)
Total Protein: 7.9 g/dL (ref 6.0–8.3)

## 2015-05-27 LAB — H. PYLORI ANTIBODY, IGG: H PYLORI IGG: NEGATIVE

## 2015-05-27 LAB — LIPASE: Lipase: 26 U/L (ref 11.0–59.0)

## 2015-05-27 MED ORDER — HYDROCHLOROTHIAZIDE 12.5 MG PO TABS: 12.5000 mg | ORAL_TABLET | Freq: Every day | ORAL | Status: DC

## 2015-05-27 NOTE — Patient Instructions (Addendum)
Please go to the lab for blood work. Then go downstairs for an x-ray. I will call you with your results.  Please continue BP medications as directed. Your BP looks good.    I encourage you to increase hydration and the amount of fiber in your diet.  Start a daily probiotic (Align, Culturelle, Digestive Advantage, etc.). If no bowel movement within 24 hours, take 2 Tbs of Milk of Magnesia in a 4 oz glass of warmed prune juice every 2-3 days to help promote bowel movement. If no results within 24 hours, then repeat above regimen, adding a Dulcolax stool softener to regimen. If this does not promote a bowel movement, please call the office.  Follow-up 1 month

## 2015-05-27 NOTE — Progress Notes (Signed)
Pre visit review using our clinic review tool, if applicable. No additional management support is needed unless otherwise documented below in the visit note. 

## 2015-05-28 ENCOUNTER — Telehealth: Payer: Self-pay | Admitting: *Deleted

## 2015-05-28 DIAGNOSIS — K5900 Constipation, unspecified: Secondary | ICD-10-CM | POA: Insufficient documentation

## 2015-05-28 NOTE — Progress Notes (Signed)
Patient presents to clinic today c/o for follow-up of hypertension. Patient currently on losartan and hydrochlorothiazide. Endorses taking both as directed. Denies side effects of medicine. Patient denies chest pain, palpitations, lightheadedness, dizziness, vision changes or frequent headaches.  BP Readings from Last 3 Encounters:  05/27/15 118/60  11/26/14 158/63  09/24/14 161/69   Patient complains of abdominal aching pain sometimes notices after meals that has been intermittent over the past 6 months. Patient also endorses intermittent constipation. Endorses last bowel movement yesterday. Is able to pass gas. Denies nausea or vomiting. Denies tenesmus, melanoma or hematochezia.  Past Medical History  Diagnosis Date  . Vascular abnormality     Treated    Current Outpatient Prescriptions on File Prior to Visit  Medication Sig Dispense Refill  . losartan (COZAAR) 100 MG tablet Take 1 tablet (100 mg total) by mouth daily. 90 tablet 3  . Multiple Vitamin (MULTIVITAMIN) tablet Take 1 tablet by mouth daily.    Marland Kitchen OVER THE COUNTER MEDICATION as needed. Cough Medication     No current facility-administered medications on file prior to visit.    No Known Allergies  Family History  Problem Relation Age of Onset  . Healthy Mother     Living  . Healthy Father     Living  . Healthy Brother     x5  . Healthy Sister     x1  . Healthy Son     x2  . Healthy Daughter     x1    History   Social History  . Marital Status: Married    Spouse Name: N/A  . Number of Children: N/A  . Years of Education: N/A   Social History Main Topics  . Smoking status: Former Research scientist (life sciences)  . Smokeless tobacco: Never Used     Comment: Quit >10 yrs ago  . Alcohol Use: Yes     Comment: rare  . Drug Use: No  . Sexual Activity: No   Other Topics Concern  . None   Social History Narrative    Review of Systems - See HPI.  All other ROS are negative.  BP 118/60 mmHg  Pulse 61  Temp(Src) 97.8  F (36.6 C) (Oral)  Ht 5' 2.5" (1.588 m)  Wt 131 lb (59.421 kg)  BMI 23.56 kg/m2  SpO2 98%  Physical Exam  Constitutional: He is well-developed, well-nourished, and in no distress.  HENT:  Head: Normocephalic and atraumatic.  Eyes: Conjunctivae are normal.  Neck: Neck supple.  Cardiovascular: Normal rate, regular rhythm, normal heart sounds and intact distal pulses.   Pulmonary/Chest: Effort normal and breath sounds normal. No respiratory distress. He has no wheezes. He has no rales.  Abdominal: Soft. Bowel sounds are normal. He exhibits no distension and no mass. There is no tenderness. There is no rebound and no guarding.  Neurological: He is alert.  Skin: Skin is warm and dry. No rash noted.  Psychiatric: Affect normal.  Vitals reviewed.   Recent Results (from the past 2160 hour(s))  Lipid panel     Status: Abnormal   Collection Time: 03/12/15  8:55 AM  Result Value Ref Range   Cholesterol 158 0 - 200 mg/dL    Comment: ATP III Classification       Desirable:  < 200 mg/dL               Borderline High:  200 - 239 mg/dL          High:  > = 240  mg/dL   Triglycerides 151.0 (H) 0.0 - 149.0 mg/dL    Comment: Normal:  <150 mg/dLBorderline High:  150 - 199 mg/dL   HDL 37.70 (L) >39.00 mg/dL   VLDL 30.2 0.0 - 40.0 mg/dL   LDL Cholesterol 90 0 - 99 mg/dL   Total CHOL/HDL Ratio 4     Comment:                Men          Women1/2 Average Risk     3.4          3.3Average Risk          5.0          4.42X Average Risk          9.6          7.13X Average Risk          15.0          11.0                       NonHDL 120.30     Comment: NOTE:  Non-HDL goal should be 30 mg/dL higher than patient's LDL goal (i.e. LDL goal of < 70 mg/dL, would have non-HDL goal of < 100 mg/dL)  CBC w/Diff     Status: None   Collection Time: 05/27/15  9:51 AM  Result Value Ref Range   WBC 6.8 4.0 - 10.5 K/uL   RBC 5.13 4.22 - 5.81 Mil/uL   Hemoglobin 14.9 13.0 - 17.0 g/dL   HCT 44.2 39.0 - 52.0 %   MCV 86.2  78.0 - 100.0 fl   MCHC 33.7 30.0 - 36.0 g/dL   RDW 13.5 11.5 - 15.5 %   Platelets 227.0 150.0 - 400.0 K/uL   Neutrophils Relative % 58.4 43.0 - 77.0 %   Lymphocytes Relative 31.6 12.0 - 46.0 %   Monocytes Relative 8.1 3.0 - 12.0 %   Eosinophils Relative 1.5 0.0 - 5.0 %   Basophils Relative 0.4 0.0 - 3.0 %   Neutro Abs 4.0 1.4 - 7.7 K/uL   Lymphs Abs 2.1 0.7 - 4.0 K/uL   Monocytes Absolute 0.6 0.1 - 1.0 K/uL   Eosinophils Absolute 0.1 0.0 - 0.7 K/uL   Basophils Absolute 0.0 0.0 - 0.1 K/uL  Comp Met (CMET)     Status: None   Collection Time: 05/27/15  9:51 AM  Result Value Ref Range   Sodium 139 135 - 145 mEq/L   Potassium 3.9 3.5 - 5.1 mEq/L   Chloride 102 96 - 112 mEq/L   CO2 31 19 - 32 mEq/L   Glucose, Bld 98 70 - 99 mg/dL   BUN 23 6 - 23 mg/dL   Creatinine, Ser 1.04 0.40 - 1.50 mg/dL   Total Bilirubin 0.5 0.2 - 1.2 mg/dL   Alkaline Phosphatase 44 39 - 117 U/L   AST 27 0 - 37 U/L   ALT 20 0 - 53 U/L   Total Protein 7.9 6.0 - 8.3 g/dL   Albumin 4.7 3.5 - 5.2 g/dL   Calcium 9.8 8.4 - 10.5 mg/dL   GFR 74.24 >60.00 mL/min  Lipase     Status: None   Collection Time: 05/27/15  9:51 AM  Result Value Ref Range   Lipase 26.0 11.0 - 59.0 U/L  H. pylori antibody, IgG     Status: None   Collection Time: 05/27/15  9:51 AM  Result Value Ref  Range   H Pylori IgG Negative Negative    Assessment/Plan: Essential hypertension Well controlled with current regimen. Asymptomatic. We'll continue the same. Follow-up in 6 months.  CN (constipation) With intermittent generalized abdominal pain. Will check CBC, CMP, lipase and H. pylori. Will obtain DG of his abdomen to assess for stool burden. Stool regimen discussed. Handout given. Follow-up will be based on x-ray results.

## 2015-05-28 NOTE — Assessment & Plan Note (Signed)
Well controlled with current regimen. Asymptomatic. We'll continue the same. Follow-up in 6 months.

## 2015-05-28 NOTE — Telephone Encounter (Signed)
Called patient at [phone] [facility]) *Preferred* and left message using [employer] [place] [phone] and asked patient to return call.

## 2015-05-28 NOTE — Telephone Encounter (Signed)
-----   Message from Waldon MerlWilliam C Martin, PA-C sent at 05/27/2015  4:47 PM EDT ----- Labs all look good. X-ray reveals stool burden and concern for possible developing blockage. His bowel sounds were normal and exam was good so I do not think this is the case. I want him to continue care discussed at visit and follow-up in 1 week. If he stops having bowel movements or anything worsens, I want him to return immediately or go to the ER. Again do not think this will happen and I feel with the regimen discussed today he will get better results with stools and symptoms will resolve.

## 2015-05-28 NOTE — Assessment & Plan Note (Signed)
With intermittent generalized abdominal pain. Will check CBC, CMP, lipase and H. pylori. Will obtain DG of his abdomen to assess for stool burden. Stool regimen discussed. Handout given. Follow-up will be based on x-ray results.

## 2015-05-29 NOTE — Telephone Encounter (Signed)
Zella BallRobin spoke with patient's son.

## 2015-06-28 ENCOUNTER — Ambulatory Visit (INDEPENDENT_AMBULATORY_CARE_PROVIDER_SITE_OTHER): Payer: Medicare Other | Admitting: Physician Assistant

## 2015-06-28 ENCOUNTER — Encounter: Payer: Self-pay | Admitting: Physician Assistant

## 2015-06-28 VITALS — BP 150/60 | HR 64 | Temp 97.9°F | Ht 62.5 in | Wt 133.2 lb

## 2015-06-28 DIAGNOSIS — I10 Essential (primary) hypertension: Secondary | ICD-10-CM | POA: Diagnosis not present

## 2015-06-28 DIAGNOSIS — K5900 Constipation, unspecified: Secondary | ICD-10-CM

## 2015-06-28 DIAGNOSIS — Z23 Encounter for immunization: Secondary | ICD-10-CM

## 2015-06-28 DIAGNOSIS — Z0184 Encounter for antibody response examination: Secondary | ICD-10-CM | POA: Diagnosis not present

## 2015-06-28 NOTE — Patient Instructions (Signed)
Please stop the hydrochlorothiazide. Continue the losartan as directed. Follow-up 6 weeks. We will likely wean down on BP medication further.  Continue your regimen for constipation. Please go to the lab. I will call you with your results regarding if you are eligible for the Shingles vaccine.

## 2015-06-28 NOTE — Progress Notes (Signed)
Pre visit review using our clinic review tool, if applicable. No additional management support is needed unless otherwise documented below in the visit note. 

## 2015-06-28 NOTE — Assessment & Plan Note (Signed)
Markedly improved. Continue current regimen. 

## 2015-06-28 NOTE — Assessment & Plan Note (Signed)
Will discontinue HCTZ in case of low home BP. BP elevated today so will continue losartan now as monotherapy. Will follow-up 4 weeks for reassessment. Check home BPS and record.

## 2015-06-28 NOTE — Progress Notes (Signed)
Patient presents to clinic today for follow-up of constipation. Patient endorses following bowel regimen as discussed at last visit. Is having BM every other day without pain or bleeding. Denies abdominal pain, nausea or vomiting.  Of note, patient's BP elevated today. Notes he has stopped BP medications since his nurse told him to. States Personnel officer came out to assess him and BP was lower at 100/60 after taking medications. She told him to stop medications and follow-up with PCP. Patient denies chest pain, palpitations, lightheadedness, dizziness, vision changes or frequent headaches.   Past Medical History  Diagnosis Date  . Vascular abnormality     Treated    Current Outpatient Prescriptions on File Prior to Visit  Medication Sig Dispense Refill  . Multiple Vitamin (MULTIVITAMIN) tablet Take 1 tablet by mouth daily.    . hydrochlorothiazide (HYDRODIURIL) 12.5 MG tablet Take 1 tablet (12.5 mg total) by mouth daily. (Patient not taking: Reported on 06/28/2015) 90 tablet 1  . losartan (COZAAR) 100 MG tablet Take 1 tablet (100 mg total) by mouth daily. (Patient not taking: Reported on 06/28/2015) 90 tablet 3  . OVER THE COUNTER MEDICATION as needed. Cough Medication     No current facility-administered medications on file prior to visit.    No Known Allergies  Family History  Problem Relation Age of Onset  . Healthy Mother     Living  . Healthy Father     Living  . Healthy Brother     x5  . Healthy Sister     x1  . Healthy Son     x2  . Healthy Daughter     x1    Social History   Social History  . Marital Status: Married    Spouse Name: N/A  . Number of Children: N/A  . Years of Education: N/A   Social History Main Topics  . Smoking status: Former Research scientist (life sciences)  . Smokeless tobacco: Never Used     Comment: Quit >10 yrs ago  . Alcohol Use: Yes     Comment: rare  . Drug Use: No  . Sexual Activity: No   Other Topics Concern  . None   Social History Narrative     Review of Systems - See HPI.  All other ROS are negative.  BP 150/60 mmHg  Pulse 64  Temp(Src) 97.9 F (36.6 C) (Oral)  Ht 5' 2.5" (1.588 m)  Wt 133 lb 3.2 oz (60.419 kg)  BMI 23.96 kg/m2  SpO2 97%  Physical Exam  Constitutional: He is oriented to person, place, and time and well-developed, well-nourished, and in no distress.  HENT:  Head: Normocephalic and atraumatic.  Eyes: Conjunctivae are normal.  Neck: Neck supple.  Cardiovascular: Normal rate, regular rhythm, normal heart sounds and intact distal pulses.   Pulmonary/Chest: Effort normal and breath sounds normal. No respiratory distress. He has no wheezes. He has no rales. He exhibits no tenderness.  Neurological: He is alert and oriented to person, place, and time.  Skin: Skin is warm and dry. No rash noted.  Psychiatric: Affect normal.  Vitals reviewed.   Recent Results (from the past 2160 hour(s))  CBC w/Diff     Status: None   Collection Time: 05/27/15  9:51 AM  Result Value Ref Range   WBC 6.8 4.0 - 10.5 K/uL   RBC 5.13 4.22 - 5.81 Mil/uL   Hemoglobin 14.9 13.0 - 17.0 g/dL   HCT 44.2 39.0 - 52.0 %   MCV 86.2 78.0 - 100.0  fl   MCHC 33.7 30.0 - 36.0 g/dL   RDW 13.5 11.5 - 15.5 %   Platelets 227.0 150.0 - 400.0 K/uL   Neutrophils Relative % 58.4 43.0 - 77.0 %   Lymphocytes Relative 31.6 12.0 - 46.0 %   Monocytes Relative 8.1 3.0 - 12.0 %   Eosinophils Relative 1.5 0.0 - 5.0 %   Basophils Relative 0.4 0.0 - 3.0 %   Neutro Abs 4.0 1.4 - 7.7 K/uL   Lymphs Abs 2.1 0.7 - 4.0 K/uL   Monocytes Absolute 0.6 0.1 - 1.0 K/uL   Eosinophils Absolute 0.1 0.0 - 0.7 K/uL   Basophils Absolute 0.0 0.0 - 0.1 K/uL  Comp Met (CMET)     Status: None   Collection Time: 05/27/15  9:51 AM  Result Value Ref Range   Sodium 139 135 - 145 mEq/L   Potassium 3.9 3.5 - 5.1 mEq/L   Chloride 102 96 - 112 mEq/L   CO2 31 19 - 32 mEq/L   Glucose, Bld 98 70 - 99 mg/dL   BUN 23 6 - 23 mg/dL   Creatinine, Ser 1.04 0.40 - 1.50 mg/dL    Total Bilirubin 0.5 0.2 - 1.2 mg/dL   Alkaline Phosphatase 44 39 - 117 U/L   AST 27 0 - 37 U/L   ALT 20 0 - 53 U/L   Total Protein 7.9 6.0 - 8.3 g/dL   Albumin 4.7 3.5 - 5.2 g/dL   Calcium 9.8 8.4 - 10.5 mg/dL   GFR 74.24 >60.00 mL/min  Lipase     Status: None   Collection Time: 05/27/15  9:51 AM  Result Value Ref Range   Lipase 26.0 11.0 - 59.0 U/L  H. pylori antibody, IgG     Status: None   Collection Time: 05/27/15  9:51 AM  Result Value Ref Range   H Pylori IgG Negative Negative    Assessment/Plan: CN (constipation) Markedly improved. Continue current regimen.  Essential hypertension Will discontinue HCTZ in case of low home BP. BP elevated today so will continue losartan now as monotherapy. Will follow-up 4 weeks for reassessment. Check home BPS and record.

## 2015-07-01 LAB — VARICELLA ZOSTER ANTIBODY, IGG: Varicella IgG: 3415 Index — ABNORMAL HIGH (ref <135.00)

## 2015-07-26 ENCOUNTER — Encounter: Payer: Self-pay | Admitting: Physician Assistant

## 2015-07-26 ENCOUNTER — Ambulatory Visit (INDEPENDENT_AMBULATORY_CARE_PROVIDER_SITE_OTHER): Payer: Medicare Other | Admitting: Physician Assistant

## 2015-07-26 VITALS — BP 136/68 | HR 56 | Temp 98.0°F | Resp 14 | Ht 63.0 in | Wt 132.0 lb

## 2015-07-26 DIAGNOSIS — I10 Essential (primary) hypertension: Secondary | ICD-10-CM

## 2015-07-26 DIAGNOSIS — Z23 Encounter for immunization: Secondary | ICD-10-CM | POA: Diagnosis not present

## 2015-07-26 NOTE — Patient Instructions (Signed)
Please continue your Losartan. Do not restart the Hydrochlorothiazide.  Your home BP readings look good.  Follow-up in December for a physical.  T?ng huy?t p (Hypertension) T?ng huy?t p, th??ng ???c g?i l huy?t p cao, l khi l?c b?m mu qua ??ng m?ch c?a qu v? qu m?nh. ??ng m?ch c?a qu v? l cc m?ch mu mang mu t? tim ?i kh?p c? th? c?a qu v?. K?t qu? ?o huy?t p c m?t con s? cao v m?t con s? th?p, ch?ng h?n 110/72. Con s? cao (tm thu) l p l?c bn trong ??ng m?ch khi tim qu v? b?m. Con s? th?p (tm tr??ng) l p l?c bn trong ??ng m?ch khi tim qu v? gin ra. Huy?t p l t??ng c?n cho qu v? ph?i l d??i 120/80. Ch?ng t?ng huy?t p bu?c tim qu v? ph?i lm vi?c v?t v? h?n ?? b?m mu. ??ng m?ch c?a qu v? c th? b? h?p ho?c c?ng. Ch?ng t?ng huy?t p lm qu v? c nguy c? b? b?nh tim, ??t qu? v cc v?n ?? khc.  CC Y?U T? NGUY C? M?t s? y?u t? nguy c? d?n ??n huy?t p cao c th? ki?m sot ???c. M?t s? y?u t? khc th khng.  Nh?ng y?u t? nguy c? khng th? ki?m sot ???c bao g?m:   Ch?ng t?c. Qu v? c nguy c? cao h?n n?u qu v? l ng??i M? g?c Phi.  ?? tu?i. Nguy c? t?ng ln theo ?? tu?i.  Gi?i tnh. Nam gi?i c nguy c? cao h?n ph? n? tr??c tu?i 45. Sau tu?i 65, ph? n? c nguy c? cao h?n nam gi?i. Nh?ng y?u t? nguy c? c th? ki?m sot ???c bao g?m:  Khng t?p th? d?c ho?c cc ho?t ??ng th? ch?t ??y ??Marland Kitchen  Th?a cn.  ?n qu nhi?u ch?t bo, ???ng, ca-lo, ho?c mu?i.  U?ng qu nhi?u r??u. D?U HI?U V TRI?U CH?NG T?ng huy?t p th??ng khng gy ra d?u hi?u ho?c tri?u ch?ng. Huy?t p r?t cao (c?n cao huy?t p) c th? gy ?au ??u, lo l?ng, kh th?, v ch?y mu cam. CH?N ?ON  ?? ki?m tra xem qu v? c t?ng huy?t p khng, chuyn gia ch?m [place] s?c kh?e c?a qu v? s? ?o huy?t p trong khi qu v? ng?i ??t tay ? m?c ngang v?i tim. Huy?t p c?n ???c ?o t nh?t hai l?n trn cng m?t cnh tay. M?t s? tnh tr?ng nh?t ??nh c th? lm cho huy?t p khc nhau gi?a tay ph?i v tay tri c?a qu  v?. K?t qu? ?o huy?t p cao h?n bnh th??ng ? m?t th?i ?i?m no ? khng c ngh?a l qu v? c?n ?i?u tr?Marland Kitchen N?u k?t qu? ?o huy?t p cao, hy h?i chuyn gia ch?m [place] s?c kh?e v? vi?c ki?m tra l?i huy?t p. ?I?U TR?  ?i?u tr? huy?t p cao gao g?m thay ??i l?i s?ng v c th? ph?i dng thu?c. C m?t l?i s?ng lnh m?nh c th? gip lm gi?m huy?t p cao. Qu v? c th? c?n thay ??i m?t s? thi quen. Thay ??i l?i s?ng c th? bao g?m:  Th?c hi?n ch? ?? ?n DASH. Ch? ?? ?n ny c nhi?u tri cy, rau, v ng? c?c nguyn h?t. C t mu?i, th?t ??, v t b? sung ???ng.  Hy dnh 2 1/2 ti?ng ho?t ??ng thn th? nhanh m?i tu?n.  Gi?m cn n?u c?n thi?t.  Khng ht thu?c.  H?n ch? ?? u?ng c c?n.  H?c cc cch gi?m c?ng th?ng. N?u thay ??i l?i s?ng khng ?? ?? ??a huy?t p v? m?c c th? ki?m sot ???c, chuyn gia ch?m [place] s?c kh?e c th? k ??n thu?c. Qu v? c th? c?n dng nhi?u lo?i thu?c. Ph?i h?p ch?t ch? v?i chuyn gia ch?m [place] s?c kh?e ?? tm hi?u cc nguy c? v l?i ch. H??NG D?N CH?M [place] T?I NH  Ki?m tra l?i huy?t p c?a qu v? theo ch? d?n c?a chuyn gia ch?m [place] s?c kh?e.  Ch? s? d?ng thu?c theo ch? d?n c?a chuyn gia ch?m [place] s?c kh?e. Lm theo ch? d?n m?t cch c?n th?n. Thu?c ?i?u tr? huy?t p ph?i ???c dng theo ??n ? k. Thu?c c?ng s? khng c tc d?ng khi qu v? b? li?u. Vi?c b? li?u thu?c c?ng lm qu v? c nguy c? pht sinh v?n ??Imagene Sheller ht thu?c.  Theo di huy?t p c?a qu v? ? nh theo ch? d?n c?a chuyn gia ch?m [place] s?c kh?e. ?I KHM N?U:   Qu v? ngh? qu v? c ph?n ?ng v?i thu?c ?ang dng.  Qu v? b? ?au ??u ho?c c?m th?y chng m?t ti di?n.  Qu v? b? s?ng ph ? m?t c chn.  Qu v? c v?n ?? v? th? l?c. NGAY L?P T?C ?I KHM N?U:  Qu v? b? ?au ??u n?ng ho?c l l?n.  Qu v? b? y?u b?t th??ng, t b, ho?c c?m th?y nh? ng?t x?u.  Qu v? b? ?au ng?c ho?c ?au b?ng r?t nhi?u.  Qu v? nn nhi?u l?n.  Qu v? b? kh th?. ??M B?O QU V?:   Hi?u r cc h??ng d?n ny.  S?  theo di tnh tr?ng c?a mnh.  S? yu c?u tr? gip ngay l?p t?c n?u qu v? c?m th?y khng kh?e ho?c th?y tr?m tr?ng h?n. Document Released: 10/26/2005 Document Revised: 03/12/2014 [facility] Patient Information 2015 [place], [place]. This information is not intended to replace advice given to you by your health care provider. Make sure you discuss any questions you have with your health care provider.

## 2015-07-26 NOTE — Assessment & Plan Note (Signed)
BP stable in clinic. Asymptomatic. Home BP measurements now normotensive. Will stay off of HCTZ and continue Losartan. Follow-up in December for CPE.

## 2015-07-26 NOTE — Progress Notes (Signed)
Pre visit review using our clinic review tool, if applicable. No additional management support is needed unless otherwise documented below in the visit note/SLS  

## 2015-07-26 NOTE — Progress Notes (Signed)
Patient presents to clinic today for 6 week follow-up of hypertension. Hydrochlorothiazide stopped at last visit due to low BP measurements. Is taking losartan as directed. Endorses home BP averaging 120-130s/80s. Patient denies chest pain, palpitations, lightheadedness, dizziness, vision changes or frequent headaches.  Past Medical History  Diagnosis Date  . Vascular abnormality     Treated    Current Outpatient Prescriptions on File Prior to Visit  Medication Sig Dispense Refill  . hydrochlorothiazide (HYDRODIURIL) 12.5 MG tablet Take 1 tablet (12.5 mg total) by mouth daily. 90 tablet 1  . losartan (COZAAR) 100 MG tablet Take 1 tablet (100 mg total) by mouth daily. 90 tablet 3  . Multiple Vitamin (MULTIVITAMIN) tablet Take 1 tablet by mouth daily.    Marland Kitchen OVER THE COUNTER MEDICATION as needed. Cough Medication     No current facility-administered medications on file prior to visit.    No Known Allergies  Family History  Problem Relation Age of Onset  . Healthy Mother     Living  . Healthy Father     Living  . Healthy Brother     x5  . Healthy Sister     x1  . Healthy Son     x2  . Healthy Daughter     x1    Social History   Social History  . Marital Status: Married    Spouse Name: N/A  . Number of Children: N/A  . Years of Education: N/A   Social History Main Topics  . Smoking status: Former Research scientist (life sciences)  . Smokeless tobacco: Never Used     Comment: Quit >10 yrs ago  . Alcohol Use: Yes     Comment: rare  . Drug Use: No  . Sexual Activity: No   Other Topics Concern  . None   Social History Narrative    Review of Systems - See HPI.  All other ROS are negative.  BP 136/68 mmHg  Pulse 56  Temp(Src) 98 F (36.7 C) (Oral)  Resp 14  Ht _0  (1.6 m)  Wt 132 lb (59.875 kg)  BMI 23.39 kg/m2  SpO2 98%  Physical Exam  Constitutional: He is well-developed, well-nourished, and in no distress.  HENT:  Head: Normocephalic and atraumatic.  Cardiovascular:  Normal rate, regular rhythm, normal heart sounds and intact distal pulses.   Pulmonary/Chest: Effort normal and breath sounds normal.  Skin: Skin is warm and dry. No rash noted.  Psychiatric: Affect normal.  Vitals reviewed.   Recent Results (from the past 2160 hour(s))  CBC w/Diff     Status: None   Collection Time: 05/27/15  9:51 AM  Result Value Ref Range   WBC 6.8 4.0 - 10.5 K/uL   RBC 5.13 4.22 - 5.81 Mil/uL   Hemoglobin 14.9 13.0 - 17.0 g/dL   HCT 44.2 39.0 - 52.0 %   MCV 86.2 78.0 - 100.0 fl   MCHC 33.7 30.0 - 36.0 g/dL   RDW 13.5 11.5 - 15.5 %   Platelets 227.0 150.0 - 400.0 K/uL   Neutrophils Relative % 58.4 43.0 - 77.0 %   Lymphocytes Relative 31.6 12.0 - 46.0 %   Monocytes Relative 8.1 3.0 - 12.0 %   Eosinophils Relative 1.5 0.0 - 5.0 %   Basophils Relative 0.4 0.0 - 3.0 %   Neutro Abs 4.0 1.4 - 7.7 K/uL   Lymphs Abs 2.1 0.7 - 4.0 K/uL   Monocytes Absolute 0.6 0.1 - 1.0 K/uL   Eosinophils Absolute 0.1 0.0 -  0.7 K/uL   Basophils Absolute 0.0 0.0 - 0.1 K/uL  Comp Met (CMET)     Status: None   Collection Time: 05/27/15  9:51 AM  Result Value Ref Range   Sodium 139 135 - 145 mEq/L   Potassium 3.9 3.5 - 5.1 mEq/L   Chloride 102 96 - 112 mEq/L   CO2 31 19 - 32 mEq/L   Glucose, Bld 98 70 - 99 mg/dL   BUN 23 6 - 23 mg/dL   Creatinine, Ser 1.04 0.40 - 1.50 mg/dL   Total Bilirubin 0.5 0.2 - 1.2 mg/dL   Alkaline Phosphatase 44 39 - 117 U/L   AST 27 0 - 37 U/L   ALT 20 0 - 53 U/L   Total Protein 7.9 6.0 - 8.3 g/dL   Albumin 4.7 3.5 - 5.2 g/dL   Calcium 9.8 8.4 - 10.5 mg/dL   GFR 74.24 >60.00 mL/min  Lipase     Status: None   Collection Time: 05/27/15  9:51 AM  Result Value Ref Range   Lipase 26.0 11.0 - 59.0 U/L  H. pylori antibody, IgG     Status: None   Collection Time: 05/27/15  9:51 AM  Result Value Ref Range   H Pylori IgG Negative Negative  Varicella zoster antibody, IgG     Status: Abnormal   Collection Time: 06/28/15  9:51 AM  Result Value Ref Range    Varicella IgG 3415.00 (H) <135.00 Index    Comment:   Reference Range:       <135.00 Index = Negative                  135.00-164.99 Index = Equivocal                       >=165.00 Index = Positive       Assessment/Plan: Essential hypertension BP stable in clinic. Asymptomatic. Home BP measurements now normotensive. Will stay off of HCTZ and continue Losartan. Follow-up in December for CPE.

## 2015-10-24 ENCOUNTER — Telehealth: Payer: Self-pay | Admitting: Behavioral Health

## 2015-10-24 NOTE — Telephone Encounter (Signed)
Unable to reach patient at time of Pre-Visit Call.  No voice mailbox to leave a message.

## 2015-10-25 ENCOUNTER — Ambulatory Visit (INDEPENDENT_AMBULATORY_CARE_PROVIDER_SITE_OTHER): Payer: Medicare Other | Admitting: Physician Assistant

## 2015-10-25 ENCOUNTER — Ambulatory Visit (HOSPITAL_BASED_OUTPATIENT_CLINIC_OR_DEPARTMENT_OTHER)
Admission: RE | Admit: 2015-10-25 | Discharge: 2015-10-25 | Disposition: A | Discharge status: Home / Self Care | Payer: Medicare Other | Source: Ambulatory Visit | Attending: Physician Assistant | Admitting: Physician Assistant

## 2015-10-25 ENCOUNTER — Encounter: Payer: Self-pay | Admitting: Physician Assistant

## 2015-10-25 ENCOUNTER — Telehealth: Payer: Self-pay | Admitting: *Deleted

## 2015-10-25 VITALS — BP 158/68 | HR 61 | Temp 97.7°F | Resp 16 | Ht 63.0 in | Wt 140.0 lb

## 2015-10-25 DIAGNOSIS — M5090 Cervical disc disorder, unspecified, unspecified cervical region: Secondary | ICD-10-CM | POA: Diagnosis not present

## 2015-10-25 DIAGNOSIS — M47892 Other spondylosis, cervical region: Secondary | ICD-10-CM | POA: Diagnosis not present

## 2015-10-25 DIAGNOSIS — R7989 Other specified abnormal findings of blood chemistry: Secondary | ICD-10-CM

## 2015-10-25 DIAGNOSIS — Z136 Encounter for screening for cardiovascular disorders: Secondary | ICD-10-CM | POA: Diagnosis not present

## 2015-10-25 DIAGNOSIS — I10 Essential (primary) hypertension: Secondary | ICD-10-CM

## 2015-10-25 DIAGNOSIS — G609 Hereditary and idiopathic neuropathy, unspecified: Secondary | ICD-10-CM | POA: Insufficient documentation

## 2015-10-25 DIAGNOSIS — Z0001 Encounter for general adult medical examination with abnormal findings: Secondary | ICD-10-CM | POA: Diagnosis not present

## 2015-10-25 DIAGNOSIS — M4802 Spinal stenosis, cervical region: Secondary | ICD-10-CM

## 2015-10-25 DIAGNOSIS — Z0000 Encounter for general adult medical examination without abnormal findings: Secondary | ICD-10-CM | POA: Insufficient documentation

## 2015-10-25 DIAGNOSIS — M542 Cervicalgia: Secondary | ICD-10-CM | POA: Insufficient documentation

## 2015-10-25 DIAGNOSIS — M47812 Spondylosis without myelopathy or radiculopathy, cervical region: Secondary | ICD-10-CM | POA: Diagnosis not present

## 2015-10-25 LAB — COMPREHENSIVE METABOLIC PANEL
ALT: 16 U/L (ref 0–53)
AST: 25 U/L (ref 0–37)
Albumin: 4.4 g/dL (ref 3.5–5.2)
Alkaline Phosphatase: 44 U/L (ref 39–117)
BUN: 18 mg/dL (ref 6–23)
CO2: 27 mEq/L (ref 19–32)
Calcium: 9.4 mg/dL (ref 8.4–10.5)
Chloride: 104 mEq/L (ref 96–112)
Creatinine, Ser: 0.99 mg/dL (ref 0.40–1.50)
GFR: 78.49 mL/min (ref >60.00)
Glucose, Bld: 100 mg/dL — ABNORMAL HIGH (ref 70–99)
Potassium: 3.6 mEq/L (ref 3.5–5.1)
Sodium: 139 mEq/L (ref 135–145)
Total Bilirubin: 0.5 mg/dL (ref 0.2–1.2)
Total Protein: 7.6 g/dL (ref 6.0–8.3)

## 2015-10-25 LAB — URINALYSIS, ROUTINE W REFLEX MICROSCOPIC
BILIRUBIN URINE: NEGATIVE
Hgb urine dipstick: NEGATIVE
Ketones, ur: NEGATIVE
Leukocytes, UA: NEGATIVE
Nitrite: NEGATIVE
RBC / HPF: NONE SEEN (ref 0–?)
SPECIFIC GRAVITY, URINE: 1.015 (ref 1.000–1.030)
Total Protein, Urine: NEGATIVE
Urine Glucose: NEGATIVE
Urobilinogen, UA: 0.2 (ref 0.0–1.0)
WBC, UA: NONE SEEN (ref 0–?)
pH: 6 (ref 5.0–8.0)

## 2015-10-25 LAB — TSH: TSH: 1.78 u[IU]/mL (ref 0.35–4.50)

## 2015-10-25 LAB — LIPID PANEL
CHOL/HDL RATIO: 4
Cholesterol: 174 mg/dL (ref 0–200)
HDL: 42.8 mg/dL (ref >39.00)
NonHDL: 130.84
TRIGLYCERIDES: 267 mg/dL — AB (ref 0.0–149.0)
VLDL: 53.4 mg/dL — ABNORMAL HIGH (ref 0.0–40.0)

## 2015-10-25 LAB — HEMOGLOBIN A1C: Hgb A1c MFr Bld: 6 % (ref 4.6–6.5)

## 2015-10-25 LAB — LDL CHOLESTEROL, DIRECT: Direct LDL: 91 mg/dL

## 2015-10-25 LAB — VITAMIN B12: Vitamin B-12: 414 pg/mL (ref 211–911)

## 2015-10-25 MED ORDER — HYDROCHLOROTHIAZIDE 12.5 MG PO TABS: 12.5000 mg | ORAL_TABLET | Freq: Every day | ORAL | Status: DC

## 2015-10-25 MED ORDER — GABAPENTIN 100 MG PO CAPS: 100.0000 mg | ORAL_CAPSULE | Freq: Every day | ORAL | Status: DC

## 2015-10-25 MED ORDER — METHYLPREDNISOLONE 4 MG PO TBPK: ORAL_TABLET | ORAL | Status: DC

## 2015-10-25 MED ORDER — LOSARTAN POTASSIUM 100 MG PO TABS: 100.0000 mg | ORAL_TABLET | Freq: Every day | ORAL | Status: DC

## 2015-10-25 NOTE — Telephone Encounter (Signed)
Called and spoke with the pt's son Kennedy Bucker(Thanh) and informed him of the pt's recent x-ray results and note.  The son verbalized understanding and agreed for the pt to have the MRI done.//AB/CMA

## 2015-10-25 NOTE — Telephone Encounter (Signed)
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 Continue care discussed today. I would like to proceed with an MRI. Let me know if they are willing and I will place order.

## 2015-10-25 NOTE — Progress Notes (Signed)
Pre visit review using our clinic review tool, if applicable. No additional management support is needed unless otherwise documented below in the visit note/SLS  

## 2015-10-25 NOTE — Telephone Encounter (Signed)
MRI order placed.

## 2015-10-25 NOTE — Patient Instructions (Signed)
Please go to the lab for blood work. I will call you with your results.  Please continue chronic medications as directed.  Start the Gabapentin as directed with dinner to help with the burning in your feet. Take the Medrol dose pack to calm nerve irritation down in the neck. Go downstairs for x-ray. I will call you with your results. We will alter your regimen based on results.  Preventive Care for Adults, Male A healthy lifestyle and preventive care can promote health and wellness. Preventive health guidelines for men include the following key practices:  A routine yearly physical is a good way to check with your health care provider about your health and preventative screening. It is a chance to share any concerns and updates on your health and to receive a thorough exam.  Visit your dentist for a routine exam and preventative care every 6 months. Brush your teeth twice a day and floss once a day. Good oral hygiene prevents tooth decay and gum disease.  The frequency of eye exams is based on your age, health, family medical history, use of contact lenses, and other factors. Follow your health care provider's recommendations for frequency of eye exams.  Eat a healthy diet. Foods such as vegetables, fruits, whole grains, low-fat dairy products, and lean protein foods contain the nutrients you need without too many calories. Decrease your intake of foods high in solid fats, added sugars, and salt. Eat the right amount of calories for you.Get information about a proper diet from your health care provider, if necessary.  Regular physical exercise is one of the most important things you can do for your health. Most adults should get at least 150 minutes of moderate-intensity exercise (any activity that increases your heart rate and causes you to sweat) each week. In addition, most adults need muscle-strengthening exercises on 2 or more days a week.  Maintain a healthy weight. The body mass index  (BMI) is a screening tool to identify possible weight problems. It provides an estimate of body fat based on height and weight. Your health care provider can find your BMI and can help you achieve or maintain a healthy weight.For adults 20 years and older:  A BMI below 18.5 is considered underweight.  A BMI of 18.5 to 24.9 is normal.  A BMI of 25 to 29.9 is considered overweight.  A BMI of 30 and above is considered obese.  Maintain normal blood lipids and cholesterol levels by exercising and minimizing your intake of saturated fat. Eat a balanced diet with plenty of fruit and vegetables. Blood tests for lipids and cholesterol should begin at age 25 and be repeated every 5 years. If your lipid or cholesterol levels are high, you are over 50, or you are at high risk for heart disease, you may need your cholesterol levels checked more frequently.Ongoing high lipid and cholesterol levels should be treated with medicines if diet and exercise are not working.  If you smoke, find out from your health care provider how to quit. If you do not use tobacco, do not start.  Lung cancer screening is recommended for adults aged 70-80 years who are at high risk for developing lung cancer because of a history of smoking. A yearly low-dose CT scan of the lungs is recommended for people who have at least a 30-pack-year history of smoking and are a current smoker or have quit within the past 15 years. A pack year of smoking is smoking an average of 1 pack  of cigarettes a day for 1 year (for example: 1 pack a day for 30 years or 2 packs a day for 15 years). Yearly screening should continue until the smoker has stopped smoking for at least 15 years. Yearly screening should be stopped for people who develop a health problem that would prevent them from having lung cancer treatment.  If you choose to drink alcohol, do not have more than 2 drinks per day. One drink is considered to be 12 ounces (355 mL) of beer, 5 ounces  (148 mL) of wine, or 1.5 ounces (44 mL) of liquor.  Avoid use of street drugs. Do not share needles with anyone. Ask for help if you need support or instructions about stopping the use of drugs.  High blood pressure causes heart disease and increases the risk of stroke. Your blood pressure should be checked at least every 1-2 years. Ongoing high blood pressure should be treated with medicines, if weight loss and exercise are not effective.  If you are 45-79 years old, ask your health care provider if you should take aspirin to prevent heart disease.  Diabetes screening is done by taking a blood sample to check your blood glucose level after you have not eaten for a certain period of time (fasting). If you are not overweight and you do not have risk factors for diabetes, you should be screened once every 3 years starting at age 45. If you are overweight or obese and you are 40-70 years of age, you should be screened for diabetes every year as part of your cardiovascular risk assessment.  Colorectal cancer can be detected and often prevented. Most routine colorectal cancer screening begins at the age of 50 and continues through age 75. However, your health care provider may recommend screening at an earlier age if you have risk factors for colon cancer. On a yearly basis, your health care provider may provide home test kits to check for hidden blood in the stool. Use of a small camera at the end of a tube to directly examine the colon (sigmoidoscopy or colonoscopy) can detect the earliest forms of colorectal cancer. Talk to your health care provider about this at age 50, when routine screening begins. Direct exam of the colon should be repeated every 5-10 years through age 75, unless early forms of precancerous polyps or small growths are found.  People who are at an increased risk for hepatitis B should be screened for this virus. You are considered at high risk for hepatitis B if:  You were born in a  country where hepatitis B occurs often. Talk with your health care provider about which countries are considered high risk.  Your parents were born in a high-risk country and you have not received a shot to protect against hepatitis B (hepatitis B vaccine).  You have HIV or AIDS.  You use needles to inject street drugs.  You live with, or have sex with, someone who has hepatitis B.  You are a man who has sex with other men (MSM).  You get hemodialysis treatment.  You take certain medicines for conditions such as cancer, organ transplantation, and autoimmune conditions.  Hepatitis C blood testing is recommended for all people born from 1945 through 1965 and any individual with known risks for hepatitis C.  Practice safe sex. Use condoms and avoid high-risk sexual practices to reduce the spread of sexually transmitted infections (STIs). STIs include gonorrhea, chlamydia, syphilis, trichomonas, herpes, HPV, and human immunodeficiency virus (HIV). Herpes,   HIV, and HPV are viral illnesses that have no cure. They can result in disability, cancer, and death.  If you are a man who has sex with other men, you should be screened at least once per year for:  HIV.  Urethral, rectal, and pharyngeal infection of gonorrhea, chlamydia, or both.  If you are at risk of being infected with HIV, it is recommended that you take a prescription medicine daily to prevent HIV infection. This is called preexposure prophylaxis (PrEP). You are considered at risk if:  You are a man who has sex with other men (MSM) and have other risk factors.  You are a heterosexual man, are sexually active, and are at increased risk for HIV infection.  You take drugs by injection.  You are sexually active with a partner who has HIV.  Talk with your health care provider about whether you are at high risk of being infected with HIV. If you choose to begin PrEP, you should first be tested for HIV. You should then be tested  every 3 months for as long as you are taking PrEP.  A one-time screening for abdominal aortic aneurysm (AAA) and surgical repair of large AAAs by ultrasound are recommended for men ages 1 to 33 years who are current or former smokers.  Healthy men should no longer receive prostate-specific antigen (PSA) blood tests as part of routine cancer screening. Talk with your health care provider about prostate cancer screening.  Testicular cancer screening is not recommended for adult males who have no symptoms. Screening includes self-exam, a health care provider exam, and other screening tests. Consult with your health care provider about any symptoms you have or any concerns you have about testicular cancer.  Use sunscreen. Apply sunscreen liberally and repeatedly throughout the day. You should seek shade when your shadow is shorter than you. Protect yourself by wearing long sleeves, pants, a wide-brimmed hat, and sunglasses year round, whenever you are outdoors.  Once a month, do a whole-body skin exam, using a mirror to look at the skin on your back. Tell your health care provider about new moles, moles that have irregular borders, moles that are larger than a pencil eraser, or moles that have changed in shape or color.  Stay current with required vaccines (immunizations).  Influenza vaccine. All adults should be immunized every year.  Tetanus, diphtheria, and acellular pertussis (Td, Tdap) vaccine. An adult who has not previously received Tdap or who does not know his vaccine status should receive 1 dose of Tdap. This initial dose should be followed by tetanus and diphtheria toxoids (Td) booster doses every 10 years. Adults with an unknown or incomplete history of completing a 3-dose immunization series with Td-containing vaccines should begin or complete a primary immunization series including a Tdap dose. Adults should receive a Td booster every 10 years.  Varicella vaccine. An adult without  evidence of immunity to varicella should receive 2 doses or a second dose if he has previously received 1 dose.  Human papillomavirus (HPV) vaccine. Males aged 11-21 years who have not received the vaccine previously should receive the 3-dose series. Males aged 22-26 years may be immunized. Immunization is recommended through the age of 49 years for any male who has sex with males and did not get any or all doses earlier. Immunization is recommended for any person with an immunocompromised condition through the age of 54 years if he did not get any or all doses earlier. During the 3-dose series, the second  dose should be obtained 4-8 weeks after the first dose. The third dose should be obtained 24 weeks after the first dose and 16 weeks after the second dose.  Zoster vaccine. One dose is recommended for adults aged 20 years or older unless certain conditions are present.  Measles, mumps, and rubella (MMR) vaccine. Adults born before 40 generally are considered immune to measles and mumps. Adults born in [birth year] or later should have 1 or more doses of MMR vaccine unless there is a contraindication to the vaccine or there is laboratory evidence of immunity to each of the three diseases. A routine second dose of MMR vaccine should be obtained at least 28 days after the first dose for students attending postsecondary schools, health care workers, or international travelers. People who received inactivated measles vaccine or an unknown type of measles vaccine during 1963-1967 should receive 2 doses of MMR vaccine. People who received inactivated mumps vaccine or an unknown type of mumps vaccine before 1979 and are at high risk for mumps infection should consider immunization with 2 doses of MMR vaccine. Unvaccinated health care workers born before [birth year] who lack laboratory evidence of measles, mumps, or rubella immunity or laboratory confirmation of disease should consider measles and mumps immunization with 2 doses  of MMR vaccine or rubella immunization with 1 dose of MMR vaccine.  Pneumococcal 13-valent conjugate (PCV13) vaccine. When indicated, a person who is uncertain of his immunization history and has no record of immunization should receive the PCV13 vaccine. All adults 39 years of age and older should receive this vaccine. An adult aged 35 years or older who has certain medical conditions and has not been previously immunized should receive 1 dose of PCV13 vaccine. This PCV13 should be followed with a dose of pneumococcal polysaccharide (PPSV23) vaccine. Adults who are at high risk for pneumococcal disease should obtain the PPSV23 vaccine at least 8 weeks after the dose of PCV13 vaccine. Adults older than 74 years of age who have normal immune system function should obtain the PPSV23 vaccine dose at least 1 year after the dose of PCV13 vaccine.  Pneumococcal polysaccharide (PPSV23) vaccine. When PCV13 is also indicated, PCV13 should be obtained first. All adults aged 66 years and older should be immunized. An adult younger than age 26 years who has certain medical conditions should be immunized. Any person who resides in a nursing home or long-term care facility should be immunized. An adult smoker should be immunized. People with an immunocompromised condition and certain other conditions should receive both PCV13 and PPSV23 vaccines. People with human immunodeficiency virus (HIV) infection should be immunized as soon as possible after diagnosis. Immunization during chemotherapy or radiation therapy should be avoided. Routine use of PPSV23 vaccine is not recommended for American Indians, [place] Natives, or people younger than 65 years unless there are medical conditions that require PPSV23 vaccine. When indicated, people who have unknown immunization and have no record of immunization should receive PPSV23 vaccine. One-time revaccination 5 years after the first dose of PPSV23 is recommended for people aged 19-64  years who have chronic kidney failure, nephrotic syndrome, asplenia, or immunocompromised conditions. People who received 1-2 doses of PPSV23 before age 84 years should receive another dose of PPSV23 vaccine at age 63 years or later if at least 5 years have passed since the previous dose. Doses of PPSV23 are not needed for people immunized with PPSV23 at or after age 50 years.  Meningococcal vaccine. Adults with asplenia or persistent complement component deficiencies  should receive 2 doses of quadrivalent meningococcal conjugate (MenACWY-D) vaccine. The doses should be obtained at least 2 months apart. Microbiologists working with certain meningococcal bacteria, [employer] recruits, people at risk during an outbreak, and people who travel to or live in countries with a high rate of meningitis should be immunized. A first-year college student up through age 78 years who is living in a residence hall should receive a dose if he did not receive a dose on or after his 16th birthday. Adults who have certain high-risk conditions should receive one or more doses of vaccine.  Hepatitis A vaccine. Adults who wish to be protected from this disease, have chronic liver disease, work with hepatitis A-infected animals, work in hepatitis A research labs, or travel to or work in countries with a high rate of hepatitis A should be immunized. Adults who were previously unvaccinated and who anticipate close contact with an international adoptee during the first 60 days after arrival in the [place] from a country with a high rate of hepatitis A should be immunized.  Hepatitis B vaccine. Adults should be immunized if they wish to be protected from this disease, are under age 29 years and have diabetes, have chronic liver disease, have had more than one sex partner in the past 6 months, may be exposed to blood or other infectious body fluids, are household contacts or sex partners of hepatitis B positive people, are clients or  workers in certain care facilities, or travel to or work in countries with a high rate of hepatitis B.  Haemophilus influenzae type b (Hib) vaccine. A previously unvaccinated person with asplenia or sickle cell disease or having a scheduled splenectomy should receive 1 dose of Hib vaccine. Regardless of previous immunization, a recipient of a hematopoietic stem cell transplant should receive a 3-dose series 6-12 months after his successful transplant. Hib vaccine is not recommended for adults with HIV infection. Preventive Service / Frequency Ages 41 to 42  Blood pressure check.** / Every 3-5 years.  Lipid and cholesterol check.** / Every 5 years beginning at age 73.  Hepatitis C blood test.** / For any individual with known risks for hepatitis C.  Skin self-exam. / Monthly.  Influenza vaccine. / Every year.  Tetanus, diphtheria, and acellular pertussis (Tdap, Td) vaccine.** / Consult your health care provider. 1 dose of Td every 10 years.  Varicella vaccine.** / Consult your health care provider.  HPV vaccine. / 3 doses over 6 months, if 90 or younger.  Measles, mumps, rubella (MMR) vaccine.** / You need at least 1 dose of MMR if you were born in 1957 or later. You may also need a second dose.  Pneumococcal 13-valent conjugate (PCV13) vaccine.** / Consult your health care provider.  Pneumococcal polysaccharide (PPSV23) vaccine.** / 1 to 2 doses if you smoke cigarettes or if you have certain conditions.  Meningococcal vaccine.** / 1 dose if you are age 77 to 27 years and a Market researcher living in a residence hall, or have one of several medical conditions. You may also need additional booster doses.  Hepatitis A vaccine.** / Consult your health care provider.  Hepatitis B vaccine.** / Consult your health care provider.  Haemophilus influenzae type b (Hib) vaccine.** / Consult your health care provider. Ages 63 to 50  Blood pressure check.** / Every year.  Lipid and  cholesterol check.** / Every 5 years beginning at age 50.  Lung cancer screening. / Every year if you are aged 51-80 years and  have a 30-pack-year history of smoking and currently smoke or have quit within the past 15 years. Yearly screening is stopped once you have quit smoking for at least 15 years or develop a health problem that would prevent you from having lung cancer treatment.  Fecal occult blood test (FOBT) of stool. / Every year beginning at age 20 and continuing until age 27. You may not have to do this test if you get a colonoscopy every 10 years.  Flexible sigmoidoscopy** or colonoscopy.** / Every 5 years for a flexible sigmoidoscopy or every 10 years for a colonoscopy beginning at age 73 and continuing until age 82.  Hepatitis C blood test.** / For all people born from [birth year] through 1965 and any individual with known risks for hepatitis C.  Skin self-exam. / Monthly.  Influenza vaccine. / Every year.  Tetanus, diphtheria, and acellular pertussis (Tdap/Td) vaccine.** / Consult your health care provider. 1 dose of Td every 10 years.  Varicella vaccine.** / Consult your health care provider.  Zoster vaccine.** / 1 dose for adults aged 17 years or older.  Measles, mumps, rubella (MMR) vaccine.** / You need at least 1 dose of MMR if you were born in 1957 or later. You may also need a second dose.  Pneumococcal 13-valent conjugate (PCV13) vaccine.** / Consult your health care provider.  Pneumococcal polysaccharide (PPSV23) vaccine.** / 1 to 2 doses if you smoke cigarettes or if you have certain conditions.  Meningococcal vaccine.** / Consult your health care provider.  Hepatitis A vaccine.** / Consult your health care provider.  Hepatitis B vaccine.** / Consult your health care provider.  Haemophilus influenzae type b (Hib) vaccine.** / Consult your health care provider. Ages 27 and over  Blood pressure check.** / Every year.  Lipid and cholesterol check.**/ Every 5 years  beginning at age 14.  Lung cancer screening. / Every year if you are aged 57-80 years and have a 30-pack-year history of smoking and currently smoke or have quit within the past 15 years. Yearly screening is stopped once you have quit smoking for at least 15 years or develop a health problem that would prevent you from having lung cancer treatment.  Fecal occult blood test (FOBT) of stool. / Every year beginning at age 36 and continuing until age 33. You may not have to do this test if you get a colonoscopy every 10 years.  Flexible sigmoidoscopy** or colonoscopy.** / Every 5 years for a flexible sigmoidoscopy or every 10 years for a colonoscopy beginning at age 30 and continuing until age 38.  Hepatitis C blood test.** / For all people born from [birth year] through 1965 and any individual with known risks for hepatitis C.  Abdominal aortic aneurysm (AAA) screening.** / A one-time screening for ages 30 to 47 years who are current or former smokers.  Skin self-exam. / Monthly.  Influenza vaccine. / Every year.  Tetanus, diphtheria, and acellular pertussis (Tdap/Td) vaccine.** / 1 dose of Td every 10 years.  Varicella vaccine.** / Consult your health care provider.  Zoster vaccine.** / 1 dose for adults aged 35 years or older.  Pneumococcal 13-valent conjugate (PCV13) vaccine.** / 1 dose for all adults aged 1 years and older.  Pneumococcal polysaccharide (PPSV23) vaccine.** / 1 dose for all adults aged 63 years and older.  Meningococcal vaccine.** / Consult your health care provider.  Hepatitis A vaccine.** / Consult your health care provider.  Hepatitis B vaccine.** / Consult your health care provider.  Haemophilus influenzae type b (  Hib) vaccine.** / Consult your health care provider. **Family history and personal history of risk and conditions may change your health care provider's recommendations.   This information is not intended to replace advice given to you by your health care  provider. Make sure you discuss any questions you have with your health care provider.   Document Released: 12/22/2001 Document Revised: 11/16/2014 Document Reviewed: 03/23/2011 Elsevier Interactive Patient Education [employer].

## 2015-10-25 NOTE — Progress Notes (Signed)
Patient presents to clinic today for annual exam with [facility].  Patient is fasting for labs.  Acute Concerns: Patient notes sensation of heat and tingling of feet during the night, every night for 1 month. Does not radiate. Denies history of diabetes or vitamin deficiency. Is not a smoker but was for a few years when younger.  Patient c/o R arm pain x 1 month radiating down extremity. Pain is described as sharp in nature associated with numbness in the R arm intermittently. Endorses associated neck pain with symptoms. Denies trauma or injury. Denies heavy lifting. Endorses similar symptoms > 20 years ago.   Chronic Issues: Hypertension -- Is currently on combination of HCTZ and losartan. Normally takes as directed but has been out of medication for 20 days. Patient denies chest pain, palpitations, lightheadedness, dizziness, vision changes or frequent headaches.  BP Readings from Last 3 Encounters:  10/25/15 158/68  07/26/15 136/68  06/28/15 150/60   Health Maintenance: Immunizations -- up-to-date on flu and tetanus. Declines zostavax. Colonoscopy -- 2010 per patient.  Past Medical History  Diagnosis Date  . Vascular abnormality     Treated    Past Surgical History  Procedure Laterality Date  . Tooth extraction      Full Dentures    Current Outpatient Prescriptions on File Prior to Visit  Medication Sig Dispense Refill  . docusate sodium (COLACE) 100 MG capsule Take 100 mg by mouth daily as needed for mild constipation.    . Multiple Vitamin (MULTIVITAMIN) tablet Take 1 tablet by mouth daily.    Marland Kitchen omega-3 fish oil (MAXEPA) 1000 MG CAPS capsule Take by mouth daily.    Marland Kitchen OVER THE COUNTER MEDICATION as needed. Cough Medication     No current facility-administered medications on file prior to visit.    No Known Allergies  Family History  Problem Relation Age of Onset  . Healthy Mother     Living  . Healthy Father     Living  . Healthy Brother     x5  .  Healthy Sister     x1  . Healthy Son     x2  . Healthy Daughter     x1    Social History   Social History  . Marital Status: Married    Spouse Name: N/A  . Number of Children: N/A  . Years of Education: N/A   Occupational History  . Not on file.   Social History Main Topics  . Smoking status: Former Research scientist (life sciences)  . Smokeless tobacco: Never Used     Comment: Quit >10 yrs ago  . Alcohol Use: Yes     Comment: rare  . Drug Use: No  . Sexual Activity: No   Other Topics Concern  . Not on file   Social History Narrative   Review of Systems  Constitutional: Negative for fever and weight loss.  HENT: Negative for ear discharge, ear pain, hearing loss and tinnitus.   Eyes: Negative for blurred vision, double vision, photophobia and pain.  Respiratory: Negative for cough and shortness of breath.   Cardiovascular: Negative for chest pain and palpitations.  Gastrointestinal: Negative for heartburn, nausea, vomiting, abdominal pain, diarrhea, constipation, blood in stool and melena.  Genitourinary: Negative for dysuria, urgency, frequency, hematuria and flank pain.  Musculoskeletal: Positive for joint pain and neck pain. Negative for falls.  Neurological: Positive for sensory change. Negative for dizziness, loss of consciousness and headaches.  Endo/Heme/Allergies: Negative for environmental allergies.  Psychiatric/Behavioral: Negative for  depression, suicidal ideas, hallucinations and substance abuse. The patient is not nervous/anxious and does not have insomnia.    BP 158/68 mmHg  Pulse 61  Temp(Src) 97.7 F (36.5 C) (Oral)  Resp 16  Ht 5' 3"  (1.6 m)  Wt 140 lb (63.504 kg)  BMI 24.81 kg/m2  SpO2 98%  Physical Exam  Constitutional: He is oriented to person, place, and time and well-developed, well-nourished, and in no distress.  HENT:  Head: Normocephalic and atraumatic.  Right Ear: External ear normal.  Left Ear: External ear normal.  Nose: Nose normal.  Mouth/Throat:  Oropharynx is clear and moist. No oropharyngeal exudate.  Eyes: Conjunctivae and EOM are normal. Pupils are equal, round, and reactive to light.  Neck: Neck supple. No thyromegaly present.  Cardiovascular: Normal rate, regular rhythm, normal heart sounds and intact distal pulses.   Pulmonary/Chest: Effort normal and breath sounds normal. No respiratory distress. He has no wheezes. He has no rales. He exhibits no tenderness.  Abdominal: Soft. Bowel sounds are normal. He exhibits no distension and no mass. There is no tenderness. There is no rebound and no guarding.  Genitourinary: Testes/scrotum normal.  Pt defer  Musculoskeletal:       Right shoulder: Normal.       Right elbow: Normal.      Cervical back: He exhibits pain. He exhibits no tenderness, no bony tenderness and no spasm.  Lymphadenopathy:    He has no cervical adenopathy.  Neurological: He is alert and oriented to person, place, and time.  Skin: Skin is warm and dry. No rash noted.  Psychiatric: Affect normal.  Vitals reviewed.  Recent Results (from the past 2160 hour(s))  Comp Met (CMET)     Status: Abnormal   Collection Time: 10/25/15  9:13 AM  Result Value Ref Range   Sodium 139 135 - 145 mEq/L   Potassium 3.6 3.5 - 5.1 mEq/L   Chloride 104 96 - 112 mEq/L   CO2 27 19 - 32 mEq/L   Glucose, Bld 100 (H) 70 - 99 mg/dL   BUN 18 6 - 23 mg/dL   Creatinine, Ser 0.99 0.40 - 1.50 mg/dL   Total Bilirubin 0.5 0.2 - 1.2 mg/dL   Alkaline Phosphatase 44 39 - 117 U/L   AST 25 0 - 37 U/L   ALT 16 0 - 53 U/L   Total Protein 7.6 6.0 - 8.3 g/dL   Albumin 4.4 3.5 - 5.2 g/dL   Calcium 9.4 8.4 - 10.5 mg/dL   GFR 78.49 >60.00 mL/min  TSH     Status: None   Collection Time: 10/25/15  9:13 AM  Result Value Ref Range   TSH 1.78 0.35 - 4.50 uIU/mL  Hemoglobin A1c     Status: None   Collection Time: 10/25/15  9:13 AM  Result Value Ref Range   Hgb A1c MFr Bld 6.0 4.6 - 6.5 %    Comment: Glycemic Control Guidelines for People with  Diabetes:Non Diabetic:  <6%Goal of Therapy: <7%Additional Action Suggested:  >8%   Urinalysis, Routine w reflex microscopic     Status: None   Collection Time: 10/25/15  9:13 AM  Result Value Ref Range   Color, Urine YELLOW Yellow;Lt. Yellow   APPearance CLEAR Clear   Specific Gravity, Urine 1.015 1.000-1.030   pH 6.0 5.0 - 8.0   Total Protein, Urine NEGATIVE Negative   Urine Glucose NEGATIVE Negative   Ketones, ur NEGATIVE Negative   Bilirubin Urine NEGATIVE Negative   Hgb urine dipstick  NEGATIVE Negative   Urobilinogen, UA 0.2 0.0 - 1.0   Leukocytes, UA NEGATIVE Negative   Nitrite NEGATIVE Negative   WBC, UA none seen 0-2/hpf   RBC / HPF none seen 0-2/hpf   Squamous Epithelial / LPF Rare(0-4/hpf) Rare(0-4/hpf)  Lipid Profile     Status: Abnormal   Collection Time: 10/25/15  9:13 AM  Result Value Ref Range   Cholesterol 174 0 - 200 mg/dL    Comment: ATP III Classification       Desirable:  < 200 mg/dL               Borderline High:  200 - 239 mg/dL          High:  > = 240 mg/dL   Triglycerides 267.0 (H) 0.0 - 149.0 mg/dL    Comment: Normal:  <150 mg/dLBorderline High:  150 - 199 mg/dL   HDL 42.80 >39.00 mg/dL   VLDL 53.4 (H) 0.0 - 40.0 mg/dL   Total CHOL/HDL Ratio 4     Comment:                Men          Women1/2 Average Risk     3.4          3.3Average Risk          5.0          4.42X Average Risk          9.6          7.13X Average Risk          15.0          11.0                       NonHDL 130.84     Comment: NOTE:  Non-HDL goal should be 30 mg/dL higher than patient's LDL goal (i.e. 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 Concern of nerve inflammation/compression due to radicular symptoms. Rx Medrol pack. Gabapentin each evening. Will likely need to proceed with MRI as PT will unlikely be beneficial in this case and medications are only treating symptoms.  Essential hypertension Patient has been out of medication. Asymptomatic. Will resume medication regimen. Will check BMP today.  Hereditary and idiopathic peripheral neuropathy Will assess A1C and B12 level. Patient is a non-smoker. Will begin Gabapentin 100 mg each evening. Will titrate to therapeutic response. Follow-up 1 month.  Screening for ischemic heart disease EKG reveals sinus bradycardia at rate of 56 bpm. Asymptomatic. Will check lipid panel today. Continue 81 mg ASA daily.  Visit for preventive health examination Depression screen negative. Health Maintenance reviewed -- Declines Zostavax. Flu, Pneumonia and Tetanus up-to-date. Colonoscopy up-to-date. Preventive schedule discussed and handout given in AVS. Will obtain fasting labs today.

## 2015-10-27 NOTE — Assessment & Plan Note (Signed)
Depression screen negative. Health Maintenance reviewed -- Declines Zostavax. Flu, Pneumonia and Tetanus up-to-date. Colonoscopy up-to-date. Preventive schedule discussed and handout given in AVS. Will obtain fasting labs today.

## 2015-10-27 NOTE — Assessment & Plan Note (Signed)
Patient has been out of medication. Asymptomatic. Will resume medication regimen. Will check BMP today.

## 2015-10-27 NOTE — Assessment & Plan Note (Signed)
EKG reveals sinus bradycardia at rate of 56 bpm. Asymptomatic. Will check lipid panel today. Continue 81 mg ASA daily.

## 2015-10-27 NOTE — Assessment & Plan Note (Signed)
Will assess A1C and B12 level. Patient is a non-smoker. Will begin Gabapentin 100 mg each evening. Will titrate to therapeutic response. Follow-up 1 month.

## 2015-10-27 NOTE — Assessment & Plan Note (Signed)
Will first obtain x-ray of cervical spine. Concern of nerve inflammation/compression due to radicular symptoms. Rx Medrol pack. Gabapentin each evening. Will likely need to proceed with MRI as PT will unlikely be beneficial in this case and medications are only treating symptoms.

## 2015-10-28 ENCOUNTER — Telehealth: Payer: Self-pay | Admitting: Physician Assistant

## 2015-10-28 NOTE — Telephone Encounter (Signed)
Pt son called to tell you pt does take fish oil everyday.

## 2015-10-29 MED ORDER — ICOSAPENT ETHYL 1 G PO CAPS: 2.0000 g | ORAL_CAPSULE | Freq: Two times a day (BID) | ORAL | Status: DC

## 2015-10-29 NOTE — Telephone Encounter (Signed)
Sent in Rx Vascepa to take as directed instead of OTC fish oils. This will do a better job. Follow-up as scheduled

## 2015-10-29 NOTE — Telephone Encounter (Signed)
Called and spoke with the pt's son on (10/28/15) and informed him of the pt's recent lab results and note.  The son verbalized understanding. The son stated that he was not sure if the pt was taking the fish oil daily.  He stated that he will call back and let me know if the pt if taking the fish oil.  See note below regarding the fish oil.//AB/CMA

## 2015-10-29 NOTE — Telephone Encounter (Signed)
Called and LMOM with the pt's son @ 12:00pm @ [phone]) asking the son to RTC regarding note about new medication.//AB/CMA

## 2015-10-29 NOTE — Telephone Encounter (Signed)
-----   Message from Waldon MerlWilliam C Martin, PA-C sent at 10/26/2015  3:50 PM EST ----- Physical labs look great overall LDL cholesterol is < 100 which is fantastic. His TGL are elevated in the 200s. Is he taking fish oil daily? If not he needs to start. If so, then we may need to start an Rx medication. His sugar level and B12 are normal so not contributing to his neuropathy. Continue medications as directed. Will call with MRI results once I have them. Will see him at follow-up in 1 month.

## 2015-10-30 MED ORDER — FENOFIBRATE 145 MG PO TABS: 145.0000 mg | ORAL_TABLET | Freq: Every day | ORAL | Status: DC

## 2015-10-30 NOTE — Telephone Encounter (Signed)
Pt 's son called back to say the new medication that was prescribed (Vascepa) was going to be ($200.00) and the pt will not be able to get it.  Informed the son that I verbally informed Selena BattenCody of the message and he would like to switch the medication.  Informed him that the new medication is not a type of fish oil, but it will work the same.  The son verbalized understanding.  New prescription (Fenofibrate 145mg ) was sent to the pharmacy by e-script.//AB/CMA

## 2015-10-30 NOTE — Addendum Note (Signed)
Addended by: Verdie ShireBAYNES, ANGELA M on: 10/30/2015 12:01 PM   Modules accepted: Orders

## 2015-10-30 NOTE — Telephone Encounter (Addendum)
Spoke with the pt's son and informed him of the note below.  Son verbalized understanding.//AB/CMA

## 2015-11-09 ENCOUNTER — Ambulatory Visit (HOSPITAL_BASED_OUTPATIENT_CLINIC_OR_DEPARTMENT_OTHER)
Admission: RE | Admit: 2015-11-09 | Discharge: 2015-11-09 | Disposition: A | Discharge status: Home / Self Care | Payer: Medicare Other | Source: Ambulatory Visit | Attending: Physician Assistant | Admitting: Physician Assistant

## 2015-11-09 DIAGNOSIS — M4802 Spinal stenosis, cervical region: Secondary | ICD-10-CM | POA: Diagnosis not present

## 2015-11-09 DIAGNOSIS — M542 Cervicalgia: Secondary | ICD-10-CM | POA: Diagnosis not present

## 2015-11-09 DIAGNOSIS — M2578 Osteophyte, vertebrae: Secondary | ICD-10-CM | POA: Diagnosis not present

## 2015-11-09 DIAGNOSIS — M47812 Spondylosis without myelopathy or radiculopathy, cervical region: Secondary | ICD-10-CM | POA: Diagnosis not present

## 2015-11-25 ENCOUNTER — Ambulatory Visit (INDEPENDENT_AMBULATORY_CARE_PROVIDER_SITE_OTHER): Payer: Medicare Other | Admitting: Physician Assistant

## 2015-11-25 ENCOUNTER — Encounter: Payer: Self-pay | Admitting: Physician Assistant

## 2015-11-25 VITALS — BP 132/64 | HR 66 | Temp 97.7°F | Ht 63.0 in | Wt 139.6 lb

## 2015-11-25 DIAGNOSIS — M5090 Cervical disc disorder, unspecified, unspecified cervical region: Secondary | ICD-10-CM

## 2015-11-25 DIAGNOSIS — E782 Mixed hyperlipidemia: Secondary | ICD-10-CM | POA: Diagnosis not present

## 2015-11-25 LAB — LIPID PANEL
CHOLESTEROL: 172 mg/dL (ref 0–200)
HDL: 52.6 mg/dL (ref >39.00)
LDL CALC: 92 mg/dL (ref 0–99)
NonHDL: 119.46
TRIGLYCERIDES: 137 mg/dL (ref 0.0–149.0)
Total CHOL/HDL Ratio: 3
VLDL: 27.4 mg/dL (ref 0.0–40.0)

## 2015-11-25 NOTE — Assessment & Plan Note (Signed)
Replaced referral to Neurosurgery for assessment as it is a new year.

## 2015-11-25 NOTE — Progress Notes (Signed)
Pre visit review using our clinic review tool, if applicable. No additional management support is needed unless otherwise documented below in the visit note. 

## 2015-11-25 NOTE — Progress Notes (Signed)
Patient presents to clinic today for follow-up of elevated triglycerides after being started on Fenofibrate 145 mcg daily. Patient endorses taking as directed. Denies side effect of medications. Is watching diet and staying active.  Patient and son checking on status of Neurosurgery referral. Has not heard yet.  Past Medical History  Diagnosis Date  . Vascular abnormality     Treated    Current Outpatient Prescriptions on File Prior to Visit  Medication Sig Dispense Refill  . docusate sodium (COLACE) 100 MG capsule Take 100 mg by mouth daily as needed for mild constipation.    . fenofibrate (TRICOR) 145 MG tablet Take 1 tablet (145 mg total) by mouth daily. 30 tablet 3  . gabapentin (NEURONTIN) 100 MG capsule Take 1 capsule (100 mg total) by mouth at bedtime. 30 capsule 3  . hydrochlorothiazide (HYDRODIURIL) 12.5 MG tablet Take 1 tablet (12.5 mg total) by mouth daily. 90 tablet 1  . Icosapent Ethyl 1 G CAPS Take 2 g by mouth 2 (two) times daily. 120 capsule 1  . losartan (COZAAR) 100 MG tablet Take 1 tablet (100 mg total) by mouth daily. 90 tablet 1  . Multiple Vitamin (MULTIVITAMIN) tablet Take 1 tablet by mouth daily.    Marland Kitchen OVER THE COUNTER MEDICATION as needed. Cough Medication     No current facility-administered medications on file prior to visit.    No Known Allergies  Family History  Problem Relation Age of Onset  . Healthy Mother     Living  . Healthy Father     Living  . Healthy Brother     x5  . Healthy Sister     x1  . Healthy Son     x2  . Healthy Daughter     x1    Social History   Social History  . Marital Status: Married    Spouse Name: N/A  . Number of Children: N/A  . Years of Education: N/A   Social History Main Topics  . Smoking status: Former Research scientist (life sciences)  . Smokeless tobacco: Never Used     Comment: Quit >10 yrs ago  . Alcohol Use: Yes     Comment: rare  . Drug Use: No  . Sexual Activity: No   Other Topics Concern  . None   Social  History Narrative   Review of Systems - See HPI.  All other ROS are negative.  BP 132/64 mmHg  Pulse 66  Temp(Src) 97.7 F (36.5 C) (Oral)  Ht _0  (1.6 m)  Wt 139 lb 9.6 oz (63.322 kg)  BMI 24.74 kg/m2  SpO2 97%  Physical Exam  Constitutional: He is oriented to person, place, and time and well-developed, well-nourished, and in no distress.  HENT:  Head: Normocephalic and atraumatic.  Eyes: Conjunctivae are normal.  Neck: Neck supple.  Cardiovascular: Normal rate, regular rhythm, normal heart sounds and intact distal pulses.   Pulmonary/Chest: Effort normal and breath sounds normal. No respiratory distress. He has no wheezes. He has no rales. He exhibits no tenderness.  Abdominal: Soft. Bowel sounds are normal. He exhibits no distension and no mass. There is no tenderness. There is no rebound and no guarding.  Neurological: He is alert and oriented to person, place, and time.  Vitals reviewed.   Recent Results (from the past 2160 hour(s))  Comp Met (CMET)     Status: Abnormal   Collection Time: 10/25/15  9:13 AM  Result Value Ref Range   Sodium 139 135 - 145 mEq/L  Potassium 3.6 3.5 - 5.1 mEq/L   Chloride 104 96 - 112 mEq/L   CO2 27 19 - 32 mEq/L   Glucose, Bld 100 (H) 70 - 99 mg/dL   BUN 18 6 - 23 mg/dL   Creatinine, Ser 0.99 0.40 - 1.50 mg/dL   Total Bilirubin 0.5 0.2 - 1.2 mg/dL   Alkaline Phosphatase 44 39 - 117 U/L   AST 25 0 - 37 U/L   ALT 16 0 - 53 U/L   Total Protein 7.6 6.0 - 8.3 g/dL   Albumin 4.4 3.5 - 5.2 g/dL   Calcium 9.4 8.4 - 10.5 mg/dL   GFR 78.49 >60.00 mL/min  TSH     Status: None   Collection Time: 10/25/15  9:13 AM  Result Value Ref Range   TSH 1.78 0.35 - 4.50 uIU/mL  Hemoglobin A1c     Status: None   Collection Time: 10/25/15  9:13 AM  Result Value Ref Range   Hgb A1c MFr Bld 6.0 4.6 - 6.5 %    Comment: Glycemic Control Guidelines for People with Diabetes:Non Diabetic:  <6%Goal of Therapy: <7%Additional Action Suggested:  >8%     Urinalysis, Routine w reflex microscopic     Status: None   Collection Time: 10/25/15  9:13 AM  Result Value Ref Range   Color, Urine YELLOW Yellow;Lt. Yellow   APPearance CLEAR Clear   Specific Gravity, Urine 1.015 1.000-1.030   pH 6.0 5.0 - 8.0   Total Protein, Urine NEGATIVE Negative   Urine Glucose NEGATIVE Negative   Ketones, ur NEGATIVE Negative   Bilirubin Urine NEGATIVE Negative   Hgb urine dipstick NEGATIVE Negative   Urobilinogen, UA 0.2 0.0 - 1.0   Leukocytes, UA NEGATIVE Negative   Nitrite NEGATIVE Negative   WBC, UA none seen 0-2/hpf   RBC / HPF none seen 0-2/hpf   Squamous Epithelial / LPF Rare(0-4/hpf) Rare(0-4/hpf)  Lipid Profile     Status: Abnormal   Collection Time: 10/25/15  9:13 AM  Result Value Ref Range   Cholesterol 174 0 - 200 mg/dL    Comment: ATP III Classification       Desirable:  < 200 mg/dL               Borderline High:  200 - 239 mg/dL          High:  > = 240 mg/dL   Triglycerides 267.0 (H) 0.0 - 149.0 mg/dL    Comment: Normal:  <150 mg/dLBorderline High:  150 - 199 mg/dL   HDL 42.80 >39.00 mg/dL   VLDL 53.4 (H) 0.0 - 40.0 mg/dL   Total CHOL/HDL Ratio 4     Comment:                Men          Women1/2 Average Risk     3.4          3.3Average Risk          5.0          4.42X Average Risk          9.6          7.13X Average Risk          15.0          11.0                       NonHDL 130.84     Comment: NOTE:  Non-HDL goal should  be 30 mg/dL higher than patient's LDL goal (i.e. 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  Elevated triglycerides with high cholesterol Tolerating medication well. Will repeat lipids today.

## 2015-11-25 NOTE — Patient Instructions (Signed)
Please go to the lab for blood work. I will call you with your results. Continue medications as directed.  You will be contacted by Neurosurgery for an appointment.  I encourage you to increase hydration and the amount of fiber in your diet.  Start a daily probiotic (Align, Culturelle, Digestive Advantage, etc.). If no bowel movement within 24 hours, take 2 Tbs of Milk of Magnesia in a 4 oz glass of warmed prune juice every 2-3 days to help promote bowel movement. If no results within 24 hours, then repeat above regimen, adding a Dulcolax stool softener to regimen. If this does not promote a bowel movement, please call the office.

## 2015-11-25 NOTE — Assessment & Plan Note (Signed)
Tolerating medication well. Will repeat lipids today.

## 2016-01-03 ENCOUNTER — Telehealth: Payer: Self-pay | Admitting: *Deleted

## 2016-01-03 DIAGNOSIS — Z1211 Encounter for screening for malignant neoplasm of colon: Secondary | ICD-10-CM

## 2016-01-03 NOTE — Telephone Encounter (Signed)
Will need to call pharmacy and see if they can help [place] find a covered alternative. It may be that a different fibrate is now preferred and cheaper. Will replace referral for colonoscopy.

## 2016-01-03 NOTE — Telephone Encounter (Signed)
Called and spoke with the pt's son Kennedy Bucker) and he stated that the pt went to the pharmacy to get the Fenofibrate and it cost him $60.00.  He only had to pay $10.00 last time.  I explained to the son that the cost my have changed depending on the medication because of the insurance.  He verbalized understanding.  He also want to know if the pt could be given something different and cheaper.  He also mentioned that the pt never received a call regarding a referral to GI for Colonoscope.  Please advise.//AB/CMA

## 2016-01-06 ENCOUNTER — Encounter: Payer: Self-pay | Admitting: Internal Medicine

## 2016-01-07 NOTE — Telephone Encounter (Signed)
Called the patient informed of PCP's information regarding medication.  The patient did verbalize understanding and agreed to do.

## 2016-01-07 NOTE — Telephone Encounter (Signed)
Spoke to the pharamcist, he stated the patient picked up the end of January this medication and paid the $60 plus amount for it.  So he was confused as to if he has paid for it already and taking does he want to change.  The pharmacist did state he said there were several generics available that would  Be less expensive, but has no way of knowing until insurance goes through.

## 2016-01-07 NOTE — Telephone Encounter (Signed)
Would recommend since repeat TGL looked good that we attempt a trial off of medication. Patient is to take daily krill oil supplement or fish oil supplement. Limit sugars and alcohols. Will recheck TGL in 3 months. If going back up we will work to find a cheaper prescription.

## 2016-04-16 ENCOUNTER — Other Ambulatory Visit: Payer: Self-pay | Admitting: Physician Assistant

## 2016-04-16 NOTE — Telephone Encounter (Signed)
Last refill #30 with 3 refills Last office visit 11/25/2015

## 2016-07-03 ENCOUNTER — Ambulatory Visit (INDEPENDENT_AMBULATORY_CARE_PROVIDER_SITE_OTHER): Payer: Medicare Other | Admitting: Physician Assistant

## 2016-07-03 ENCOUNTER — Encounter: Payer: Self-pay | Admitting: Physician Assistant

## 2016-07-03 VITALS — BP 146/72 | HR 53 | Temp 97.6°F | Resp 16 | Ht 63.0 in | Wt 135.1 lb

## 2016-07-03 DIAGNOSIS — L723 Sebaceous cyst: Secondary | ICD-10-CM | POA: Insufficient documentation

## 2016-07-03 DIAGNOSIS — I10 Essential (primary) hypertension: Secondary | ICD-10-CM

## 2016-07-03 DIAGNOSIS — G609 Hereditary and idiopathic neuropathy, unspecified: Secondary | ICD-10-CM | POA: Diagnosis not present

## 2016-07-03 MED ORDER — PREGABALIN 25 MG PO CAPS: 25.0000 mg | ORAL_CAPSULE | Freq: Every evening | ORAL | 1 refills | Status: DC

## 2016-07-03 MED ORDER — HYDROCHLOROTHIAZIDE 12.5 MG PO TABS: 12.5000 mg | ORAL_TABLET | Freq: Every day | ORAL | 1 refills | Status: DC

## 2016-07-03 MED ORDER — LOSARTAN POTASSIUM 100 MG PO TABS: 100.0000 mg | ORAL_TABLET | Freq: Every day | ORAL | 1 refills | Status: DC

## 2016-07-03 MED ORDER — PREGABALIN 25 MG PO CAPS: 25.0000 mg | ORAL_CAPSULE | Freq: Two times a day (BID) | ORAL | 1 refills | Status: DC

## 2016-07-03 NOTE — Progress Notes (Signed)
Visit was conducted using video interpreter services as patient does not speak [place].  Patient presents to clinic today for follow-up of hypertension. Patient is currently on a regimen of hydrochlorothiazide and losartan. Has been taking daily as directed, but has been out of medication for greater than one month. Patient denies chest pain, palpitations, lightheadedness, dizziness, vision changes or frequent headaches.  BP Readings from Last 3 Encounters:  07/03/16 (!) 146/72  11/25/15 132/64  10/25/15 (!) 158/68   Patient also endorses continued neuropathy symptoms of feet, worse at night. Patient was previously prescribed gabapentin for trial relief. Patient states he took a few of the pills, but the patient unable to sleep since he stopped the medication. We'll discuss other options.  Patient complains of an itchy bump on the upper back. Endorses he "pops" this from time to time. Denies pain. Would like this assessed today..  Past Medical History:  Diagnosis Date  . Vascular abnormality    Treated    Current Outpatient Prescriptions on File Prior to Visit  Medication Sig Dispense Refill  . docusate sodium (COLACE) 100 MG capsule Take 100 mg by mouth daily as needed for mild constipation.    . fenofibrate (TRICOR) 145 MG tablet Take 1 tablet (145 mg total) by mouth daily. 30 tablet 3  . Icosapent Ethyl 1 G CAPS Take 2 g by mouth 2 (two) times daily. 120 capsule 1  . Multiple Vitamin (MULTIVITAMIN) tablet Take 1 tablet by mouth daily.    Marland Kitchen. OVER THE COUNTER MEDICATION as needed. Cough Medication     No current facility-administered medications on file prior to visit.     No Known Allergies  Family History  Problem Relation Age of Onset  . Healthy Mother     Living  . Healthy Father     Living  . Healthy Brother     x5  . Healthy Sister     x1  . Healthy Son     x2  . Healthy Daughter     x1    Social History   Social History  . Marital status: Married   Spouse name: N/A  . Number of children: N/A  . Years of education: N/A   Social History Main Topics  . Smoking status: Former Games developermoker  . Smokeless tobacco: Never Used     Comment: Quit >10 yrs ago  . Alcohol use Yes     Comment: rare  . Drug use: No  . Sexual activity: No   Other Topics Concern  . None   Social History Narrative  . None    Review of Systems - See HPI.  All other ROS are negative.  BP (!) 146/72 (BP Location: Left Arm, Patient Position: Sitting, Cuff Size: Normal) Comment: Pt is out of Medication x1 month  Pulse (!) 53   Temp 97.6 F (36.4 C) (Oral)   Resp 16   Ht 5\' 3"  (1.6 m)   Wt 135 lb 2 oz (61.3 kg)   SpO2 98%   BMI 23.94 kg/m   Physical Exam  Constitutional: He is oriented to person, place, and time and well-developed, well-nourished, and in no distress.  HENT:  Head: Normocephalic and atraumatic.  Eyes: Conjunctivae are normal.  Neck: Neck supple.  Cardiovascular: Normal rate, regular rhythm, normal heart sounds and intact distal pulses.   Pulmonary/Chest: Effort normal and breath sounds normal. No respiratory distress. He has no wheezes. He has no rales. He exhibits no tenderness.  Lymphadenopathy:  He has no cervical adenopathy.  Neurological: He is alert and oriented to person, place, and time.  Skin: Skin is warm and dry.     Psychiatric: Affect normal.  Vitals reviewed.   No results found for this or any previous visit (from the past 2160 hour(s)).  Assessment/Plan: Hereditary and idiopathic peripheral neuropathy Will attempt trial of Lyrica 25 mg each evening. Follow-up scheduled.  Essential hypertension Previously well controlled on current regimen. Asymptomatic. Medications refilled. Patient to follow-up in one month for BP recheck and BMP.  Sebaceous cyst Uncomplicated sebaceous cysts. No evidence of infection. Referral to dermatology place for removal.    Piedad Climes, PA-C

## 2016-07-03 NOTE — Assessment & Plan Note (Signed)
Uncomplicated sebaceous cysts. No evidence of infection. Referral to dermatology place for removal.

## 2016-07-03 NOTE — Assessment & Plan Note (Signed)
Previously well controlled on current regimen. Asymptomatic. Medications refilled. Patient to follow-up in one month for BP recheck and BMP.

## 2016-07-03 NOTE — Assessment & Plan Note (Signed)
Will attempt trial of Lyrica 25 mg each evening. Follow-up scheduled.

## 2016-07-03 NOTE — Patient Instructions (Signed)
Please restart your BP medications. Start the Lyrica and take each evening as directed. Follow-up with me in 1 month for reassessment.

## 2016-07-27 DIAGNOSIS — L720 Epidermal cyst: Secondary | ICD-10-CM | POA: Diagnosis not present

## 2016-08-03 ENCOUNTER — Encounter: Payer: Self-pay | Admitting: Physician Assistant

## 2016-08-03 ENCOUNTER — Ambulatory Visit (INDEPENDENT_AMBULATORY_CARE_PROVIDER_SITE_OTHER): Payer: Medicare Other | Admitting: Physician Assistant

## 2016-08-03 VITALS — BP 137/61 | HR 58 | Temp 97.6°F | Resp 16 | Ht 63.0 in | Wt 136.1 lb

## 2016-08-03 DIAGNOSIS — G609 Hereditary and idiopathic neuropathy, unspecified: Secondary | ICD-10-CM

## 2016-08-03 DIAGNOSIS — E785 Hyperlipidemia, unspecified: Secondary | ICD-10-CM | POA: Diagnosis not present

## 2016-08-03 DIAGNOSIS — I10 Essential (primary) hypertension: Secondary | ICD-10-CM

## 2016-08-03 DIAGNOSIS — R159 Full incontinence of feces: Secondary | ICD-10-CM | POA: Diagnosis not present

## 2016-08-03 LAB — COMPREHENSIVE METABOLIC PANEL
ALT: 21 U/L (ref 0–53)
AST: 28 U/L (ref 0–37)
Albumin: 4.5 g/dL (ref 3.5–5.2)
Alkaline Phosphatase: 53 U/L (ref 39–117)
BUN: 16 mg/dL (ref 6–23)
CHLORIDE: 103 meq/L (ref 96–112)
CO2: 30 mEq/L (ref 19–32)
Calcium: 9.5 mg/dL (ref 8.4–10.5)
Creatinine, Ser: 1.03 mg/dL (ref 0.40–1.50)
GFR: 74.83 mL/min (ref >60.00)
GLUCOSE: 91 mg/dL (ref 70–99)
POTASSIUM: 3.4 meq/L — AB (ref 3.5–5.1)
Sodium: 141 mEq/L (ref 135–145)
Total Bilirubin: 0.4 mg/dL (ref 0.2–1.2)
Total Protein: 7.8 g/dL (ref 6.0–8.3)

## 2016-08-03 LAB — LIPID PANEL
Cholesterol: 154 mg/dL (ref 0–200)
HDL: 38.8 mg/dL — ABNORMAL LOW (ref >39.00)
LDL CALC: 78 mg/dL (ref 0–99)
NONHDL: 115.54
Total CHOL/HDL Ratio: 4
Triglycerides: 190 mg/dL — ABNORMAL HIGH (ref 0.0–149.0)
VLDL: 38 mg/dL (ref 0.0–40.0)

## 2016-08-03 NOTE — Progress Notes (Signed)
Patient presents to clinic today for follow-up of hypertension and peripheral neuropathy.Patient was seen with assistance of video interpreting services as patient does not speak [place].  Hypertension -- Patient endorses restarting medications and taking daily as directed. Patient denies chest pain, palpitations, lightheadedness, dizziness, vision changes or frequent headaches.  BP Readings from Last 3 Encounters:  08/03/16 137/61  07/03/16 (!) 146/72  11/25/15 132/64   Neuropathy -- Patient is taking Lyrica 25 mg as directed in the evenings. Notes improvement in neuropathic pain with medication. Is pleased with this. Does note that he will have a hard time falling asleep if he forgets to take his medication.  Patient endorses noting some mild leakage of stool occurring about a few hours after bowel movement. Denies change in bowel movement, tenesmus, melena or hematochezia. Denies abdominal pain, nausea or vomiting. Notes this issue has been present for 1-2 months.   Past Medical History:  Diagnosis Date  . Vascular abnormality    Treated    Current Outpatient Prescriptions on File Prior to Visit  Medication Sig Dispense Refill  . docusate sodium (COLACE) 100 MG capsule Take 100 mg by mouth daily as needed for mild constipation.    . fenofibrate (TRICOR) 145 MG tablet Take 1 tablet (145 mg total) by mouth daily. 30 tablet 3  . hydrochlorothiazide (HYDRODIURIL) 12.5 MG tablet Take 1 tablet (12.5 mg total) by mouth daily. 90 tablet 1  . Icosapent Ethyl 1 G CAPS Take 2 g by mouth 2 (two) times daily. 120 capsule 1  . losartan (COZAAR) 100 MG tablet Take 1 tablet (100 mg total) by mouth daily. 90 tablet 1  . Multiple Vitamin (MULTIVITAMIN) tablet Take 1 tablet by mouth daily.    Marland Kitchen OVER THE COUNTER MEDICATION as needed. Cough Medication    . pregabalin (LYRICA) 25 MG capsule Take 1 capsule (25 mg total) by mouth every evening. 30 capsule 1   No current facility-administered  medications on file prior to visit.     No Known Allergies  Family History  Problem Relation Age of Onset  . Healthy Mother     Living  . Healthy Father     Living  . Healthy Brother     x5  . Healthy Sister     x1  . Healthy Son     x2  . Healthy Daughter     x1    Social History   Social History  . Marital status: Married    Spouse name: N/A  . Number of children: N/A  . Years of education: N/A   Social History Main Topics  . Smoking status: Former Research scientist (life sciences)  . Smokeless tobacco: Never Used     Comment: Quit >10 yrs ago  . Alcohol use Yes     Comment: rare  . Drug use: No  . Sexual activity: No   Other Topics Concern  . Not on file   Social History Narrative  . No narrative on file   Review of Systems - See HPI.  All other ROS are negative.  BP 137/61 (BP Location: Right Arm, Patient Position: Sitting, Cuff Size: Normal)   Pulse (!) 58   Temp 97.6 F (36.4 C) (Oral)   Resp 16   Ht 5' 3"  (1.6 m)   Wt 136 lb 2 oz (61.7 kg)   SpO2 97%   BMI 24.11 kg/m   Physical Exam  Constitutional: He is oriented to person, place, and time and well-developed, well-nourished, and in  no distress.  HENT:  Head: Normocephalic and atraumatic.  Eyes: Conjunctivae are normal.  Neck: Neck supple.  Cardiovascular: Normal rate, regular rhythm, normal heart sounds and intact distal pulses.   Pulmonary/Chest: Effort normal and breath sounds normal. No respiratory distress. He has no wheezes. He has no rales. He exhibits no tenderness.  Genitourinary: Rectal exam shows no external hemorrhoid and no mass.  Genitourinary Comments: Good rectal tone on examination.  Neurological: He is alert and oriented to person, place, and time.  Skin: Skin is warm and dry. No rash noted.  Psychiatric: Affect normal.  Vitals reviewed.  Assessment/Plan: 1. Hereditary and idiopathic peripheral neuropathy Much improved with Lyrica. Discussed taking nightly without fail. Patient agrees. Will  continue for now.  2. Hyperlipidemia Is taking medication as directed. Patient would like to get off of some of his cholesterol medications. Will check labs today. Discussed dietary and exercise recommendations. Will alter regimen based on results. - Lipid Profile - Comp Met (CMET)  3. Essential hypertension BP normotensive. Asymptomatic. Continue current regimen. Will check BMP today.   4. Involuntary stool Rectal tone within normal limits. No palpable mass or hemorrhoids. Discussed fiber supplementation with patient. Will start daily. If not improving, will need referral to GI or colorectal surgery for further assessment.    Leeanne Rio, PA-C

## 2016-08-03 NOTE — Patient Instructions (Signed)
Please continue BP medications as directed.  Please go to the lab for blood work. I will call with results and this will help me decide on what medications we can wean you off of.   Please start fiber supplement and increased hydration.  This will help bulk stools to help leakage.  If not improving we will need to get you in with Gastroenterology.

## 2016-08-03 NOTE — Progress Notes (Signed)
Pre visit review using our clinic review tool, if applicable. No additional management support is needed unless otherwise documented below in the visit note/SLS  

## 2016-09-07 DIAGNOSIS — Z23 Encounter for immunization: Secondary | ICD-10-CM | POA: Diagnosis not present

## 2017-01-14 ENCOUNTER — Ambulatory Visit: Payer: Medicare Other | Admitting: Family Medicine

## 2017-01-14 ENCOUNTER — Telehealth: Payer: Self-pay

## 2017-01-14 NOTE — Telephone Encounter (Signed)
Unfortunately had to reschedule. Agree with above.   Jilda Rocheicholas Paul WilsonWendling, DO 01/14/17 3:52 PM

## 2017-01-14 NOTE — Telephone Encounter (Signed)
Pt speaks vi[place]namese.  Video Interpreter machine used to communicate with patient.    Pt in clinic today for office visit.  Pt was scheduled for 2:15pm appt.  Pt states he arrived at 2:15 pm, but was never taken to the back.  Pt called his son (who speaks [place]), who then called office to inform us that his father was in the lobby, but hasn't seen the doctor yet.  Son stated that he came in because he has been experiencing a headache off and on for some time now and he wants his father to be seen soon.  He also stated that patient needed to establish with a provider here in this office since [place], [place] moved to another site.  Using the interpeter machine, pt reported headache 5/10.  Pain starts at his neck and radiates up towards his forehead. Pain is intermittent and is mostly felt when he's driving and when he stops.  He denied any other symptoms.  Denied chest pain, shortness of breath, vision changes, nausea/vomiting, dizziness, fever, cough, runny nose. Pt has a hx of hypertension, but states blood pressures have been normal.  BP this morning: 106/70.  He's taking BP medications as prescribed.    VSS----T: 97.8, BP: 144/71, HR: 64, O2 Sat: 144/71  Spoke to Dr. Carmelia RollerWendling about the above.  He advised that patient be seen tomorrow.  The same was communicated with the patient. He agreed with plan.  Appt scheduled.  Pt was advised if headache worsens or new symptoms develop to go to the ER.  He stated understanding and agreed to comply.

## 2017-01-15 ENCOUNTER — Ambulatory Visit (INDEPENDENT_AMBULATORY_CARE_PROVIDER_SITE_OTHER): Payer: Medicare Other | Admitting: Family Medicine

## 2017-01-15 ENCOUNTER — Encounter: Payer: Self-pay | Admitting: Family Medicine

## 2017-01-15 VITALS — BP 110/58 | HR 62 | Temp 97.5°F | Ht 63.0 in | Wt 134.2 lb

## 2017-01-15 DIAGNOSIS — R51 Headache: Secondary | ICD-10-CM

## 2017-01-15 DIAGNOSIS — R519 Headache, unspecified: Secondary | ICD-10-CM

## 2017-01-15 NOTE — Patient Instructions (Signed)
Heat over area. Massage area every 3-4 hours. Tylenol, 2 pills every 6 hours.  If you are doing better cancel appointment.

## 2017-01-15 NOTE — Progress Notes (Signed)
Chief Complaint  Patient presents with  . Transitions Of Care    pt having headaches-x 5 days    Timothy Blankenship is a 76 y.o. male here for evaluation of headache. Here with aid of interpreter.  Duration: 5 days  Location: back of head, R side Palliation: unknown Provocation: Unknown Quality: sharp Associated symptoms: neck pain on R Denies: nausea, vomiting, photophobia, tinnitus, ataxia, numbness, tingling, weakness, dysarthria, dysphagia Currently with headache? Yes Failed therapies: None  Past Medical History:  Diagnosis Date  . Vascular abnormality    Treated   Family History  Problem Relation Age of Onset  . Healthy Mother   . Healthy Father   . Healthy Brother     x5  . Healthy Sister     x1  . Healthy Son     x2  . Healthy Daughter     x1   Current Meds  Medication Sig  . docusate sodium (COLACE) 100 MG capsule Take 100 mg by mouth daily as needed for mild constipation.  . hydrochlorothiazide (HYDRODIURIL) 12.5 MG tablet Take 1 tablet (12.5 mg total) by mouth daily.  Marland Kitchen. losartan (COZAAR) 100 MG tablet Take 1 tablet (100 mg total) by mouth daily.  . Multiple Vitamin (MULTIVITAMIN) tablet Take 1 tablet by mouth daily.  . Omega-3 Fatty Acids (FISH OIL) 1200 MG CAPS Take 1,200 mg by mouth daily.    BP (!) 110/58 (BP Location: Left Arm, Patient Position: Sitting, Cuff Size: Normal)   Pulse 62   Temp 97.5 F (36.4 C) (Oral)   Ht 5\' 3"  (1.6 m)   Wt 134 lb 3.2 oz (60.9 kg)   SpO2 98%   BMI 23.77 kg/m  General: awake, alert, appearing stated age Eyes: PERRLA, EOMi, +Grey rim around iris b/l Heart: RRR, no murmurs, no bruits Lungs: CTAB, no accessory muscle use Neuro: CN 2-12 intact, no cerebellar signs, DTR's equal and symmetry, no clonus MSK: 5/5 strength throughout, normal gait, no TTP over paraspinal cervical musculature, +TTP over R occipital triangle group on the R, not on the L Psych: Age appropriate judgment and insight, mood and affect normal  Unilateral  occipital headache  Tylenol, heat, self massage. Follow up in 1 week if no better, will inject. The patient voiced understanding and agreement to the plan.  Jilda Rocheicholas Paul [place], [place] 11:17 AM 01/15/17

## 2017-01-15 NOTE — Progress Notes (Signed)
Pre visit review using our clinic review tool, if applicable. No additional management support is needed unless otherwise documented below in the visit note. 

## 2017-01-21 ENCOUNTER — Encounter: Payer: Self-pay | Admitting: Family Medicine

## 2017-01-21 ENCOUNTER — Ambulatory Visit (INDEPENDENT_AMBULATORY_CARE_PROVIDER_SITE_OTHER): Payer: Medicare Other | Admitting: Family Medicine

## 2017-01-21 VITALS — BP 110/50 | HR 68 | Temp 97.6°F | Ht 63.0 in | Wt 137.2 lb

## 2017-01-21 DIAGNOSIS — R519 Headache, unspecified: Secondary | ICD-10-CM

## 2017-01-21 DIAGNOSIS — R51 Headache: Secondary | ICD-10-CM

## 2017-01-21 NOTE — Progress Notes (Signed)
Chief Complaint  Patient presents with  . Follow-up    on headaches-pt states h/a's better   Pt requires aid of [place] interpreter. Here for f/u headaches, feels better since starting Tylenol and massaging back of head. 30% better, pleased with progress, R neck/head radiating to top of scalp. No new symptoms.  Past Medical History:  Diagnosis Date  . Vascular abnormality    Treated    BP (!) 110/50 (BP Location: Left Arm, Patient Position: Sitting, Cuff Size: Normal)   Pulse 68   Temp 97.6 F (36.4 C) (Oral)   Ht 5\' 3"  (1.6 m)   Wt 137 lb 3.2 oz (62.2 kg)   SpO2 97%   BMI 24.30 kg/m  Gen- awake, alert Heart- RRR Neuro- 3/4 patellar DTR, 1/4 biceps and calcaneal DTR's, no clonus, no cerebellar signs MSK- +TTP over suboccipital triangle region Psych: Age appropriate judgment and insight  Unilateral occipital headache  Offered occipital nerve block, pt declined stating he is pleased with his progress. He will let us know if anything changes. F/u prn. Pt voiced understanding and agreement to plan thru interpreter.  Jilda Rocheicholas Paul [place] Creek[place], DO 01/21/17 2:18 PM

## 2017-01-21 NOTE — Progress Notes (Signed)
Pre visit review using our clinic review tool, if applicable. No additional management support is needed unless otherwise documented below in the visit note. 

## 2017-01-21 NOTE — Patient Instructions (Signed)
Let us know if anything changes

## 2017-04-04 ENCOUNTER — Encounter (HOSPITAL_BASED_OUTPATIENT_CLINIC_OR_DEPARTMENT_OTHER): Payer: Self-pay | Admitting: Emergency Medicine

## 2017-04-04 ENCOUNTER — Emergency Department (HOSPITAL_BASED_OUTPATIENT_CLINIC_OR_DEPARTMENT_OTHER)
Admission: EM | Admit: 2017-04-04 | Discharge: 2017-04-04 | Disposition: A | Discharge status: Home / Self Care | Payer: Medicare Other | Attending: Emergency Medicine | Admitting: Emergency Medicine

## 2017-04-04 DIAGNOSIS — Z79899 Other long term (current) drug therapy: Secondary | ICD-10-CM | POA: Diagnosis not present

## 2017-04-04 DIAGNOSIS — Z4800 Encounter for change or removal of nonsurgical wound dressing: Secondary | ICD-10-CM | POA: Diagnosis present

## 2017-04-04 DIAGNOSIS — I10 Essential (primary) hypertension: Secondary | ICD-10-CM | POA: Insufficient documentation

## 2017-04-04 DIAGNOSIS — Z87891 Personal history of nicotine dependence: Secondary | ICD-10-CM | POA: Diagnosis not present

## 2017-04-04 DIAGNOSIS — B029 Zoster without complications: Secondary | ICD-10-CM | POA: Insufficient documentation

## 2017-04-04 HISTORY — DX: Pure hypercholesterolemia, unspecified: E78.00

## 2017-04-04 HISTORY — DX: Essential (primary) hypertension: I10

## 2017-04-04 MED ORDER — VALACYCLOVIR HCL 1 G PO TABS: 1000.0000 mg | ORAL_TABLET | Freq: Three times a day (TID) | ORAL | 0 refills | Status: DC

## 2017-04-04 MED ORDER — VALACYCLOVIR HCL 500 MG PO TABS
1000.0000 mg | ORAL_TABLET | Freq: Once | ORAL | Status: AC
  Administered 2017-04-04: 1000 mg via ORAL
  Filled 2017-04-04: qty 2

## 2017-04-04 MED ORDER — VALACYCLOVIR HCL 500 MG PO TABS
ORAL_TABLET | ORAL | Status: AC
  Filled 2017-04-04: qty 2

## 2017-04-04 NOTE — ED Triage Notes (Signed)
Pt has puncture wound above L eye from a tree limb that occurred last Wednesday. Area is red and swollen.

## 2017-04-04 NOTE — ED Provider Notes (Signed)
[facility] Provider Note   CSN: [id number] Arrival date & time: 04/04/17  1754   By signing my name below, I, Teofilo Pod, attest that this documentation has been prepared under the direction and in the presence of Gwyneth Sprout, MD . Electronically Signed: Teofilo Pod, ED Scribe. 04/04/2017. 6:18 PM.   History   Chief Complaint Chief Complaint  Patient presents with  . Wound Check    The history is provided by the patient. No language interpreter was used.   HPI Comments:  Timothy Blankenship is a 76 y.o. male with PMHx of HTN who presents to the Emergency Department, here for a wound check for a wound sustained 7 days ago.  Per relative, the wound has become increasingly more swollen and painful. Pt thinks he may have gotten into something gardening but does not recall being hit by anything or bug bites. No alleviating factors noted. Pt denies other associated symptoms.   Past Medical History:  Diagnosis Date  . High cholesterol   . Hypertension   . Vascular abnormality    Treated    Patient Active Problem List   Diagnosis Date Noted  . Sebaceous cyst 07/03/2016  . Elevated triglycerides with high cholesterol 11/25/2015  . 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 Hereditary and idiopathic peripheral neuropathy 10/25/2015  . Visit for preventive health examination 10/25/2015  . CN (constipation) 05/28/2015  . Cataracts, bilateral 09/11/2014  . Screening for prostate cancer 09/11/2014  . Screening for ischemic heart disease 09/11/2014  . Essential hypertension 08/27/2014    Past Surgical History:  Procedure Laterality Date  . TOOTH EXTRACTION     Full Dentures       Home Medications    Prior to Admission medications   Medication Sig Start Date End Date Taking? Authorizing Provider  hydrochlorothiazide (HYDRODIURIL) 12.5 MG tablet Take 1 tablet (12.5 mg total) by mouth daily. 07/03/16  Yes Waldon Merl, PA-C    losartan (COZAAR) 100 MG tablet Take 1 tablet (100 mg total) by mouth daily. 07/03/16  Yes Waldon Merl, PA-C  docusate sodium (COLACE) 100 MG capsule Take 100 mg by mouth daily as needed for mild constipation.    [provider]  fenofibrate (TRICOR) 145 MG tablet Take 1 tablet (145 mg total) by mouth daily. Patient not taking: Reported on 01/15/2017 10/30/15   Waldon Merl, PA-C  Icosapent Ethyl 1 G CAPS Take 2 g by mouth 2 (two) times daily. Patient not taking: Reported on 01/15/2017 10/29/15   Waldon Merl, PA-C  Multiple Vitamin (MULTIVITAMIN) tablet Take 1 tablet by mouth daily.    [provider]  Omega-3 Fatty Acids (FISH OIL) 1200 MG CAPS Take 1,200 mg by mouth daily.    [provider]  pregabalin (LYRICA) 25 MG capsule Take 1 capsule (25 mg total) by mouth every evening. Patient not taking: Reported on 01/15/2017 07/03/16   Waldon Merl, PA-C    Family History Family History  Problem Relation Age of Onset  . Healthy Mother   . Healthy Father   . Healthy Brother        x5  . Healthy Sister        x1  . Healthy Son        x2  . Healthy Daughter        x1    Social History Social History  Substance Use Topics  . Smoking status: Former Games developer  . Smokeless  tobacco: Never Used     Comment: Quit >10 yrs ago  . Alcohol use Yes     Comment: rare     Allergies   Patient has no known allergies.   Review of Systems Review of Systems All systems reviewed and are negative for acute change except as noted in the HPI.   Physical Exam Updated Vital Signs BP (!) 152/82 (BP Location: Left Arm)   Pulse (!) 58   Temp 98.3 F (36.8 C) (Oral)   Resp 18   Wt 132 lb (59.9 kg)   SpO2 97%   BMI 23.38 kg/m   Physical Exam  Constitutional: He appears well-developed and well-nourished. No distress.  HENT:  Head: Normocephalic and atraumatic.  Vesicular rash involving the V1 dermatome. No eye,ear or nose involvement. No conjunctival  injection or drainage. Some lesions are already ruptured with appearance of healing. Some vesicles now erupting.   Eyes: Conjunctivae are normal.  Cardiovascular: Normal rate.   Pulmonary/Chest: Effort normal.  Abdominal: He exhibits no distension.  Neurological: He is alert.  Skin: Skin is warm and dry.  Psychiatric: He has a normal mood and affect.  Nursing note and vitals reviewed.    ED Treatments / Results  DIAGNOSTIC STUDIES:  Oxygen Saturation is 97% on RA, normal by my interpretation.    COORDINATION OF CARE:  6:06 PM Discussed treatment plan with pt at bedside and pt agreed to plan.   Labs (all labs ordered are listed, but only abnormal results are displayed) Labs Reviewed - No data to display  EKG  EKG Interpretation None       Radiology No results found.  Procedures Procedures (including critical care time)  Medications Ordered in ED Medications - No data to display   Initial Impression / Assessment and Plan / ED Course  I have reviewed the triage vital signs and the nursing notes.  Pertinent labs & imaging results that were available during my care of the patient were reviewed by me and considered in my medical decision making (see chart for details).     Patient is an elderly gentleman with evidence of shingles today. Patient's rash has been present for 7 days. He was started on Valtrex for 7 days. Patient had a CMP at the end of last year which showed a GFR in the 70s. He has a follow-up appointment with his doctor on Wednesday and encourage them to follow-up as planned  Final Clinical Impressions(s) / ED Diagnoses   Final diagnoses:  Herpes zoster without complication    New Prescriptions New Prescriptions   VALACYCLOVIR (VALTREX) 1000 MG TABLET    Take 1 tablet (1,000 mg total) by mouth 3 (three) times daily.   I personally performed the services described in this documentation, which was scribed in my presence.  The recorded information  has been reviewed and considered.     Gwyneth SproutPlunkett, Jb Dulworth, MD 04/04/17 [phone]

## 2017-04-07 ENCOUNTER — Encounter: Payer: Self-pay | Admitting: Medical

## 2017-04-07 ENCOUNTER — Ambulatory Visit (INDEPENDENT_AMBULATORY_CARE_PROVIDER_SITE_OTHER): Payer: Medicare Other | Admitting: Medical

## 2017-04-07 VITALS — BP 160/70 | HR 61 | Temp 98.1°F | Resp 16 | Ht 63.0 in | Wt 133.6 lb

## 2017-04-07 DIAGNOSIS — B029 Zoster without complications: Secondary | ICD-10-CM | POA: Diagnosis not present

## 2017-04-07 DIAGNOSIS — I10 Essential (primary) hypertension: Secondary | ICD-10-CM

## 2017-04-07 DIAGNOSIS — M792 Neuralgia and neuritis, unspecified: Secondary | ICD-10-CM | POA: Diagnosis not present

## 2017-04-07 MED ORDER — HYDROCHLOROTHIAZIDE 12.5 MG PO TABS: 12.5000 mg | ORAL_TABLET | Freq: Every day | ORAL | 0 refills | Status: DC

## 2017-04-07 MED ORDER — TRAMADOL HCL 50 MG PO TABS: 50.0000 mg | ORAL_TABLET | Freq: Two times a day (BID) | ORAL | 0 refills | Status: DC | PRN

## 2017-04-07 MED ORDER — LOSARTAN POTASSIUM 100 MG PO TABS: 100.0000 mg | ORAL_TABLET | Freq: Every day | ORAL | 0 refills | Status: DC

## 2017-04-07 NOTE — Progress Notes (Signed)
Subjective:    Patient ID: Timothy Blankenship, male    DOB: [birth year], 76 y.o.   MRN: [id number]  HPI  Pt in for follow up from the ED. Pt was seen on 04-04-2017. He was dx with shingles and given antiviral medications. Rash broke out on his left eye brow. Pain level decreased about 50% but itching still. Pt reports no changes to his vision. His pain is only in eye brow area and not in eye/orbit. Pt was given valtrex for 3 days.   Pt pain level in eye brow area is 7/10.  Interview done with interpreter.  Review of Systems  Constitutional: Negative for chills, fatigue and fever.  Respiratory: Negative for cough, chest tightness, shortness of breath and wheezing.   Cardiovascular: Negative for chest pain and palpitations.  Gastrointestinal: Negative for abdominal pain.  Musculoskeletal: Negative for back pain.  Skin: Positive for rash.  Neurological: Negative for dizziness, seizures, speech difficulty, weakness and headaches.  Hematological: Negative for adenopathy. Does not bruise/bleed easily.  Psychiatric/Behavioral: Negative for confusion and decreased concentration. The patient is not nervous/anxious.     Past Medical History:  Diagnosis Date  . High cholesterol   . Hypertension   . Vascular abnormality    Treated     Social History   Social History  . Marital status: Married    Spouse name: N/A  . Number of children: N/A  . Years of education: N/A   Occupational History  . Not on file.   Social History Main Topics  . Smoking status: Former Games developer  . Smokeless tobacco: Never Used     Comment: Quit >10 yrs ago  . Alcohol use Yes     Comment: rare  . Drug use: No  . Sexual activity: No   Other Topics Concern  . Not on file   Social History Narrative  . No narrative on file    Past Surgical History:  Procedure Laterality Date  . TOOTH EXTRACTION     Full Dentures    Family History  Problem Relation Age of Onset  . Healthy Mother   . Healthy Father   .  Healthy Brother        x5  . Healthy Sister        x1  . Healthy Son        x2  . Healthy Daughter        x1    No Known Allergies  Current Outpatient Prescriptions on File Prior to Visit  Medication Sig Dispense Refill  . docusate sodium (COLACE) 100 MG capsule Take 100 mg by mouth daily as needed for mild constipation.    . fenofibrate (TRICOR) 145 MG tablet Take 1 tablet (145 mg total) by mouth daily. (Patient not taking: Reported on 01/15/2017) 30 tablet 3  . hydrochlorothiazide (HYDRODIURIL) 12.5 MG tablet Take 1 tablet (12.5 mg total) by mouth daily. 90 tablet 1  . Icosapent Ethyl 1 G CAPS Take 2 g by mouth 2 (two) times daily. (Patient not taking: Reported on 01/15/2017) 120 capsule 1  . losartan (COZAAR) 100 MG tablet Take 1 tablet (100 mg total) by mouth daily. 90 tablet 1  . Multiple Vitamin (MULTIVITAMIN) tablet Take 1 tablet by mouth daily.    . Omega-3 Fatty Acids (FISH OIL) 1200 MG CAPS Take 1,200 mg by mouth daily.    . pregabalin (LYRICA) 25 MG capsule Take 1 capsule (25 mg total) by mouth every evening. (Patient not taking: Reported on 01/15/2017) 30  capsule 1  . valACYclovir (VALTREX) 1000 MG tablet Take 1 tablet (1,000 mg total) by mouth 3 (three) times daily. 21 tablet 0   No current facility-administered medications on file prior to visit.     BP (!) 163/63 (BP Location: Right Arm, Patient Position: Sitting, Cuff Size: Normal)   Pulse 61   Temp 98.1 F (36.7 C) (Oral)   Resp 16   Ht 5\' 3"  (1.6 m)   Wt 133 lb 9.6 oz (60.6 kg)   SpO2 98%   BMI 23.67 kg/m       Objective:   Physical Exam  General Mental Status- Alert. General Appearance- Not in acute distress.     Neck Carotid Arteries- Normal color. Moisture- Normal Moisture. No carotid bruits. No JVD.  Chest and Lung Exam Auscultation: Breath Sounds:-Normal.  Cardiovascular Auscultation:Rythm- Regular. Murmurs & Other Heart Sounds:Auscultation of the heart reveals- No  Murmurs.   Neurologic Cranial Nerve exam:- CN III-XII intact(No nystagmus), symmetric smile. Strength:- 5/5 equal and symmetric strength both upper and lower extremities.  Derm- 5 scabbed lesions left eye brow and above. No vesicles seen. No rash in temporal area.      Assessment & Plan:  For your shingles I want you to continue the valtex until finished.  For shingles pain and will rx tramadol.  Pt advised that if he has eye pain or vision changes to let us know and would need to refer to eye MD.   I want pt to follow up in 7 days with pcp for recheck and determine if longer course tramadol needed.   Pt son number [phone].(Thanh Macke)  Please take your bp medication today when you get home as it is high. You report running out of medications for more than a year ago. Will refill both bp meds today.  After reviewing pt bp over past month called pt son and advised only use losartan over next week. Then on follow up with pcp will decide if he needs to use hctz as well.

## 2017-04-07 NOTE — Patient Instructions (Addendum)
For your shingles I want you to continue the valtex until finished.  For shingles pain will rx tramadol.(limted rx given for pain)  Pt advised that if he has eye pain or vision changes to let us know and would need to refer to eye MD.   I want pt to follow up in 7 days with pcp for recheck and determine if longer course tramadol needed.   Please take your bp medication today when you get home as it is high. You report running out of medications for more than a year ago. Will refill both bp meds today.  After reviewing pt bp over past month called pt son and advised only use losartan over next week. Then on follow up with pcp will decide if he needs to use hctz as well.

## 2017-04-14 ENCOUNTER — Ambulatory Visit (INDEPENDENT_AMBULATORY_CARE_PROVIDER_SITE_OTHER): Payer: Medicare Other | Admitting: Family Medicine

## 2017-04-14 ENCOUNTER — Encounter: Payer: Self-pay | Admitting: Family Medicine

## 2017-04-14 VITALS — BP 140/70 | HR 59 | Temp 97.8°F | Ht 63.0 in | Wt 133.8 lb

## 2017-04-14 DIAGNOSIS — T148XXA Other injury of unspecified body region, initial encounter: Secondary | ICD-10-CM

## 2017-04-14 DIAGNOSIS — I10 Essential (primary) hypertension: Secondary | ICD-10-CM

## 2017-04-14 MED ORDER — HYDROCHLOROTHIAZIDE 12.5 MG PO TABS: 12.5000 mg | ORAL_TABLET | Freq: Every day | ORAL | 3 refills | Status: DC

## 2017-04-14 MED ORDER — LOSARTAN POTASSIUM 100 MG PO TABS: 100.0000 mg | ORAL_TABLET | Freq: Every day | ORAL | 3 refills | Status: DC

## 2017-04-14 NOTE — Progress Notes (Signed)
Chief Complaint  Patient presents with  . Follow-up    1 week    Subjective: Patient is a 76 y.o. male here for f/u shingles. Here with aid of [name] interpreter.  He has some lesions that have yet to heal and he is wondering what can be done for this.   Hypertension Patient presents for hypertension follow up. He does monitor home blood pressures. Running around 150's/70-80's since stopping Losartan 100 mg daily.  He is compliant with medications- HTCZ 12.5 mg daily. Patient has these side effects of medication: none He is adhering to a healthy diet overall. Exercise: Walking  ROS: Heart: Denies chest pain  Lungs: Denies SOB   Family History  Problem Relation Age of Onset  . Healthy Mother   . Healthy Father   . Healthy Brother        x5  . Healthy Sister        x1  . Healthy Son        x2  . Healthy Daughter        x1   Past Medical History:  Diagnosis Date  . High cholesterol   . Hypertension   . Vascular abnormality    Treated   No Known Allergies  Current Outpatient Prescriptions:  .  docusate sodium (COLACE) 100 MG capsule, Take 100 mg by mouth daily as needed for mild constipation., Disp: , Rfl:  .  fenofibrate (TRICOR) 145 MG tablet, Take 1 tablet (145 mg total) by mouth daily., Disp: 30 tablet, Rfl: 3 .  hydrochlorothiazide (HYDRODIURIL) 12.5 MG tablet, Take 1 tablet (12.5 mg total) by mouth daily., Disp: 90 tablet, Rfl: 3 .  Icosapent Ethyl 1 G CAPS, Take 2 g by mouth 2 (two) times daily., Disp: 120 capsule, Rfl: 1 .  Multiple Vitamin (MULTIVITAMIN) tablet, Take 1 tablet by mouth daily., Disp: , Rfl:  .  Omega-3 Fatty Acids (FISH OIL) 1200 MG CAPS, Take 1,200 mg by mouth daily., Disp: , Rfl:  .  pregabalin (LYRICA) 25 MG capsule, Take 1 capsule (25 mg total) by mouth every evening., Disp: 30 capsule, Rfl: 1 .  traMADol (ULTRAM) 50 MG tablet, Take 1 tablet (50 mg total) by mouth every 12 (twelve) hours as needed for moderate pain or severe pain.,  Disp: 8 tablet, Rfl: 0 .  losartan (COZAAR) 100 MG tablet, Take 1 tablet (100 mg total) by mouth daily., Disp: 90 tablet, Rfl: 3 .  valACYclovir (VALTREX) 1000 MG tablet, Take 1 tablet (1,000 mg total) by mouth 3 (three) times daily. (Patient not taking: Reported on 04/14/2017), Disp: 21 tablet, Rfl: 0  Objective: BP 140/70 (BP Location: Left Arm, Patient Position: Sitting, Cuff Size: Normal)   Pulse (!) 59   Temp 97.8 F (36.6 C) (Oral)   Ht 5\' 3"  (1.6 m)   Wt 133 lb 12.8 oz (60.7 kg)   SpO2 97%   BMI 23.70 kg/m  General: Awake, appears stated age HEENT: MMM, EOMi Heart: RRR, no murmurs, no bruits, no LE edema Lungs: CTAB, no rales, wheezes or rhonchi. No accessory muscle use Skin: Small areas of excoriation over L side of forehead, no drainage, erythema, foul odor or TTP to skin around openings Psych: Age appropriate judgment and insight, normal affect and mood  Assessment and Plan: Essential hypertension - Plan: hydrochlorothiazide (HYDRODIURIL) 12.5 MG tablet, losartan (COZAAR) 100 MG tablet  Excoriation  Orders as above. Restart losartan given home readings. Abx ointment for head for 7 days. Scar cream after that for  better cosmetic result. Fu in 4 mo, get labs at that time. Every 6 mo after that unless needed. The patient voiced understanding and agreement to the plan.  Jilda Rocheicholas Paul [place] Lodge[place], DO 04/14/17  10:53 AM

## 2017-04-14 NOTE — Patient Instructions (Addendum)
Apply "triple antibiotic ointment" to area on forehead twice daily for 10 days. Apply scar cream after that.  I want your blood pressure lower than 150 on the top and lower than 90 on the bottom. You are within the normal range on the top and bottom.

## 2017-10-26 IMAGING — MR MR CERVICAL SPINE W/O CM
4 of 5 series · 30 of 48 positions shown · non-contrast
Comparison: 10/25/2015 cervical spine plain film exam. No
comparison MR.

CLINICAL DATA: 74-year-old male with right scapular pain extending
down right arm to hand for the past 2-3 months. No known injury.
Subsequent encounter.

EXAM:
MRI CERVICAL SPINE WITHOUT CONTRAST
TECHNIQUE: Multiplanar, multisequence MR imaging of the cervical spine was
performed. No intravenous contrast was administered.

[Series 2: (id) tse sag · sagittal · 3.0mm · 0.41mm/px · 6 of 13 slices shown]
[im 1/13]
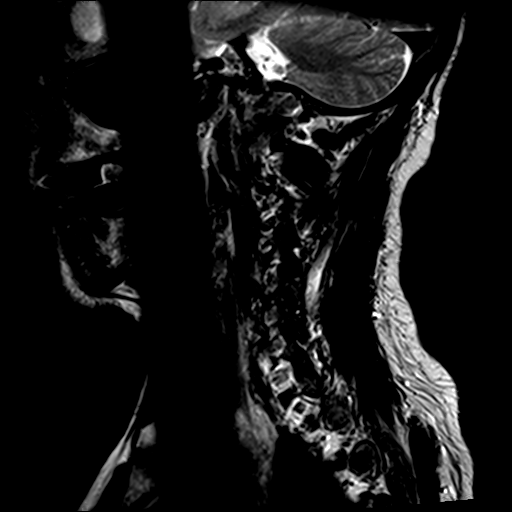
[im 3/13]
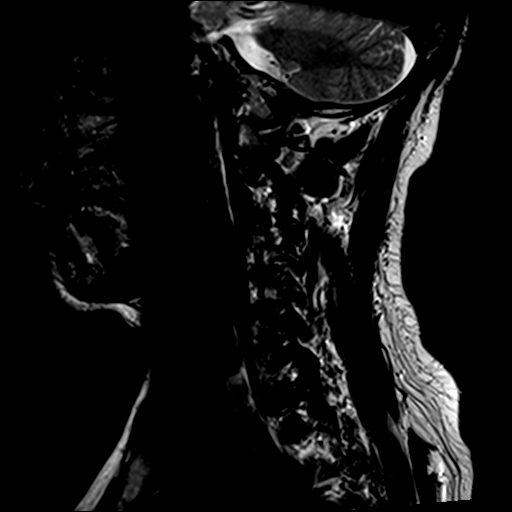
[im 5/13]
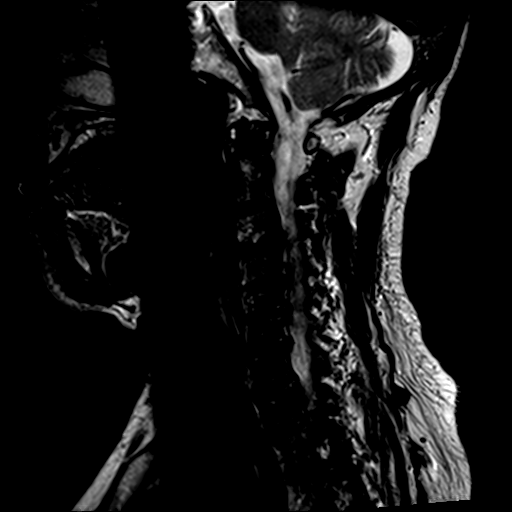
[im 8/13]
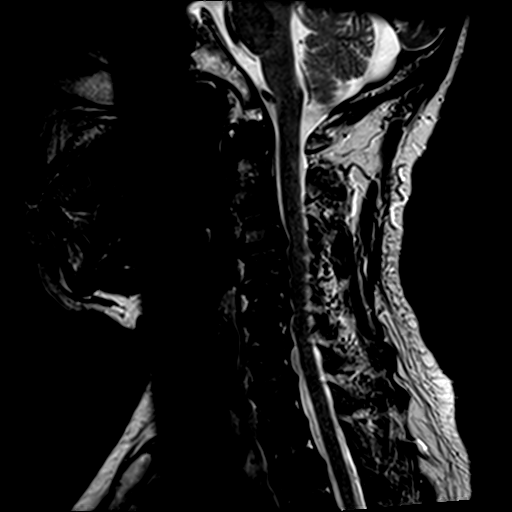
[im 10/13]
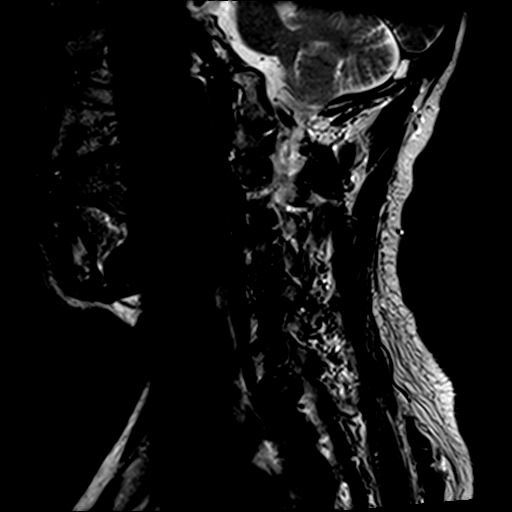
[im 13/13]
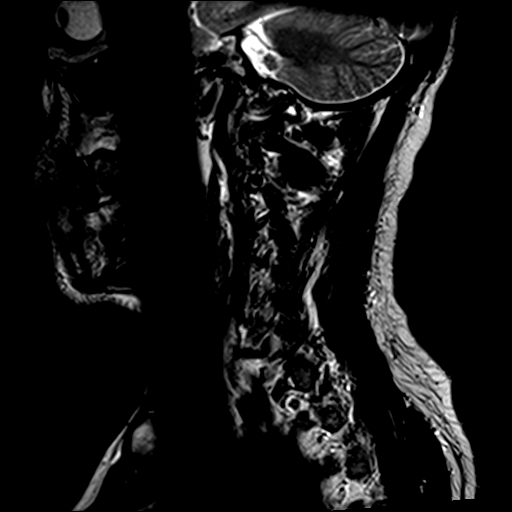

[Series 3: STIR · sagittal · 3.0mm · 0.82mm/px · 6 of 13 slices shown]
[im 1/13]
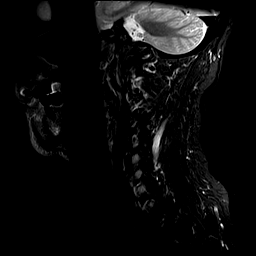
[im 3/13]
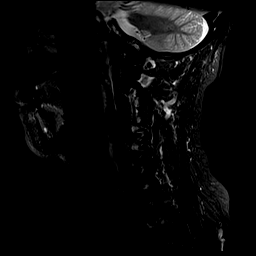
[im 5/13]
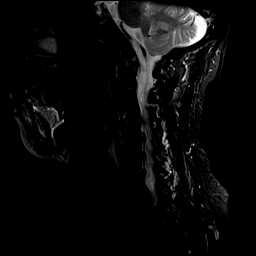
[im 8/13]
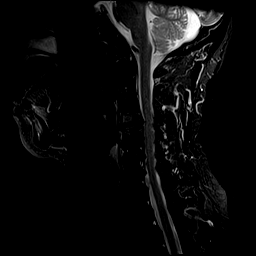
[im 10/13]
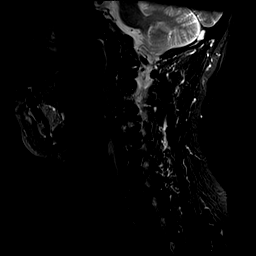
[im 13/13]
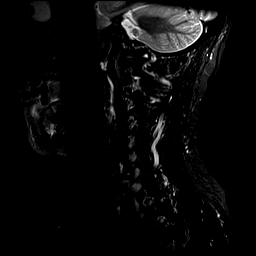

[Series 5: T2 · axial · 3.0mm · 0.39mm/px · z∈[-48,+33]mm · 9 of 30 slices shown (1 of 2)]
[im 1/30]
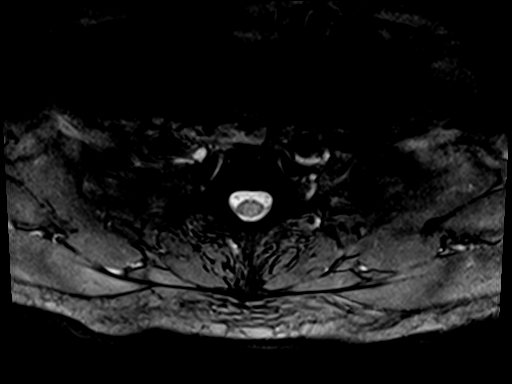
[im 5/30]
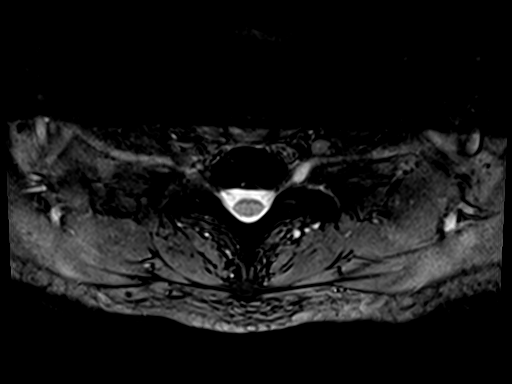
[im 9/30]
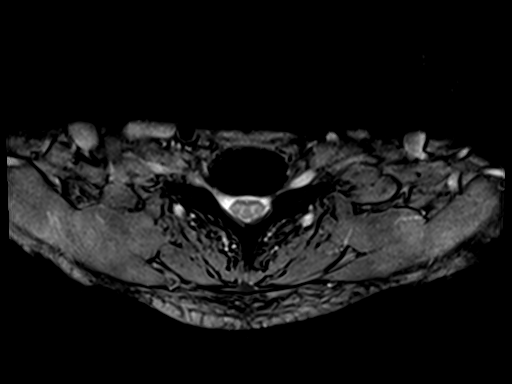
[im 13/30]
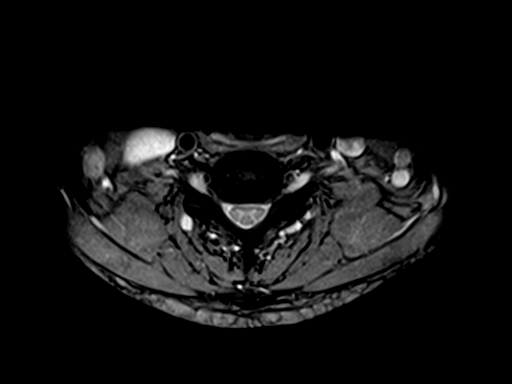
[im 15/30]
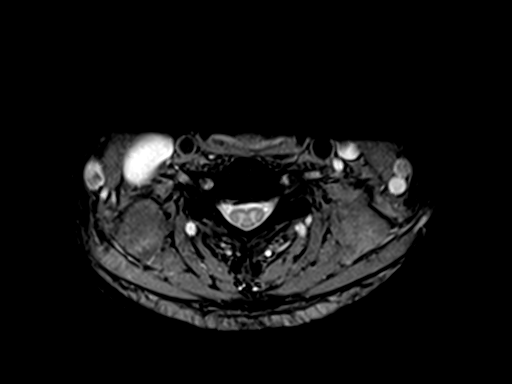
[im 17/30]
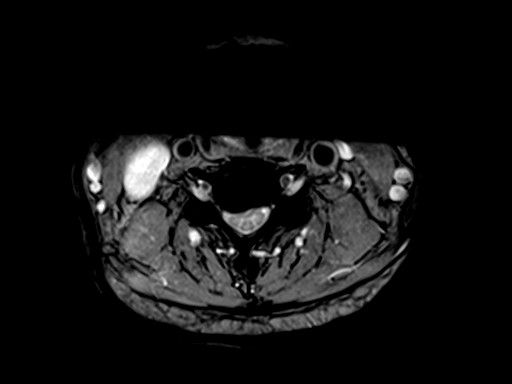
[im 21/30]
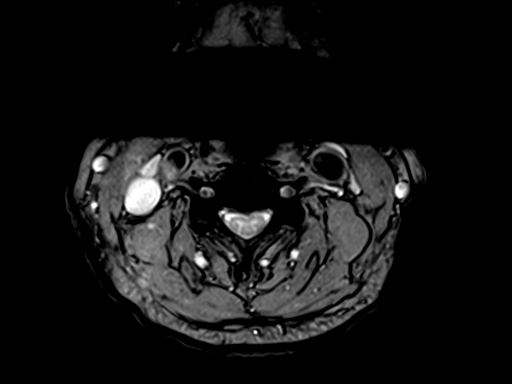
[im 25/30]
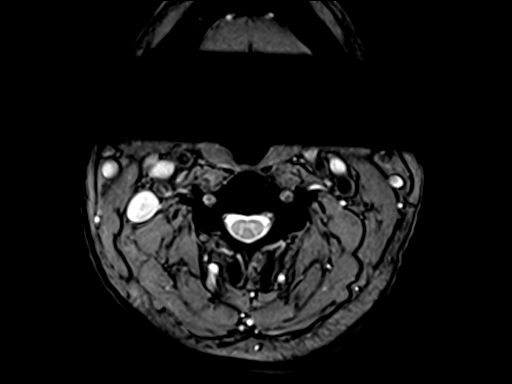
[im 30/30]
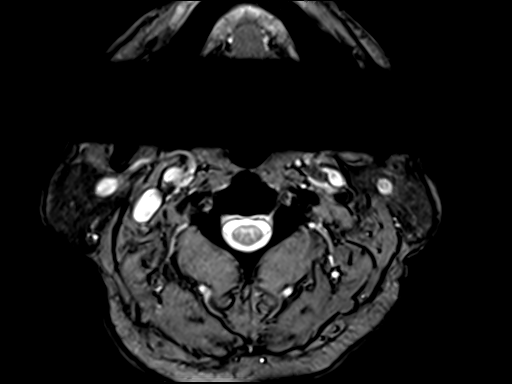

[Series 6: T2 · axial · 3.0mm · 0.62mm/px · z∈[-55,+27]mm · 9 of 30 slices shown (2 of 2)]
[im 1/30]
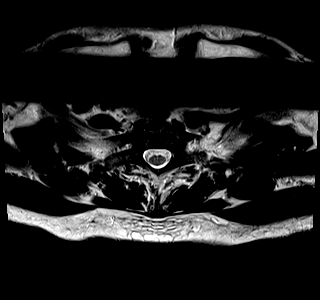
[im 5/30]
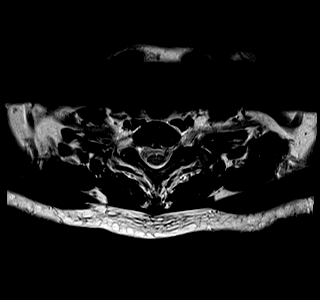
[im 9/30]
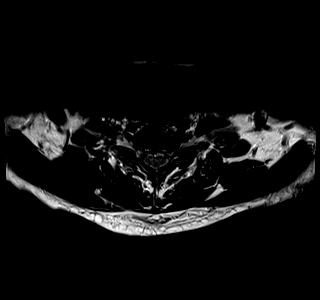
[im 13/30]
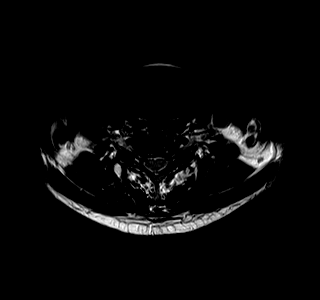
[im 15/30]
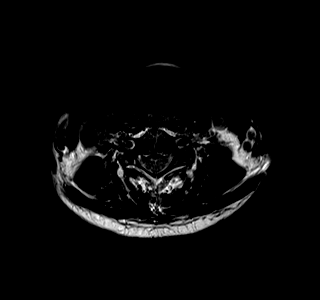
[im 17/30]
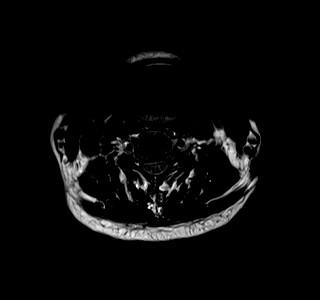
[im 21/30]
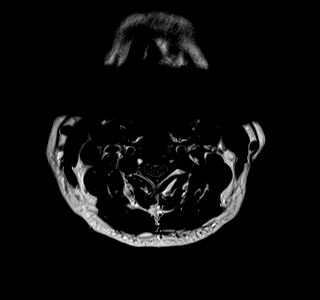
[im 25/30]
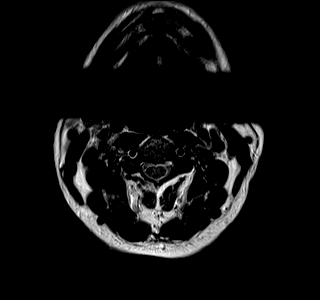
[im 30/30]
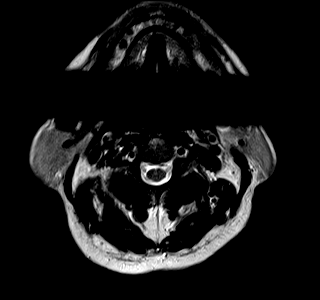

[30 of 48 positions shown; findings below may reference images not displayed]

FINDINGS: Decreased signal intensity of bone marrow may be related to
patient's habitus. Correlation with CBC to exclude anemia
contributing to this appearance may be considered.

Cervical medullary junction and visualized intracranial structures
unremarkable. No focal cervical cord signal abnormality.

Visualized paravertebral structures unremarkable.

C2-3:  No significant spinal stenosis or foraminal narrowing.

C3-4: Endplate reactive changes. Broad-based disc osteophyte
complex. Narrowing ventral thecal sac. Minimal cord contact. Mild
foraminal narrowing greater on left.

C4-5: Disc osteophyte complex with greatest extension right
paracentral position. Right-sided cord flattening. Moderate
foraminal narrowing greater on the right.

C5-6: Broad-based disc osteophyte complex. Narrowing ventral aspect
of the thecal sac with minimal cord contact. Moderate bilateral
foraminal narrowing.

C6-7: Broad-based disc osteophyte complex. Narrowing ventral thecal
sac without cord contact. Mild bilateral foraminal narrowing.

C7-T1: Minimal anterior slip C7. Minimal disc osteophyte complex.
Minimal narrowing ventral thecal sac. Very mild left foraminal
narrowing.

T1-2: Minimal disc osteophyte complex.

T2-3: Minimal disc osteophyte complex with minimal narrowing ventral
thecal sac.
IMPRESSION: Multilevel cervical spondylotic changes with findings most prominent
on the right at the C4-5 level. Summary of pertinent findings
includes:

C3-4 degenerative endplate reactive changes. Broad-based disc
osteophyte complex. Narrowing ventral thecal sac. Minimal cord
contact. Mild foraminal narrowing greater on left.

C4-5 disc osteophyte complex with greatest extension right
paracentral position. Right-sided cord flattening. Moderate
foraminal narrowing greater on the right.

C5-6 broad-based disc osteophyte complex. Narrowing ventral aspect
of the thecal sac with minimal cord contact. Moderate bilateral
foraminal narrowing.

C6-7 broad-based disc osteophyte complex. Narrowing ventral thecal
sac without cord contact. Mild bilateral foraminal narrowing.

C7-T1 very mild left foraminal narrowing.

Decreased signal intensity of bone marrow may be related to
patient's habitus. Correlation with CBC to exclude anemia
contributing to this appearance may be considered.

## 2018-01-27 ENCOUNTER — Ambulatory Visit (INDEPENDENT_AMBULATORY_CARE_PROVIDER_SITE_OTHER): Payer: Medicare Other | Admitting: Family Medicine

## 2018-01-27 ENCOUNTER — Telehealth: Payer: Self-pay | Admitting: *Deleted

## 2018-01-27 ENCOUNTER — Encounter: Payer: Self-pay | Admitting: Family Medicine

## 2018-01-27 ENCOUNTER — Encounter: Payer: Self-pay | Admitting: Internal Medicine

## 2018-01-27 VITALS — BP 118/70 | HR 62 | Temp 97.5°F | Ht 63.0 in | Wt 137.4 lb

## 2018-01-27 DIAGNOSIS — Z1211 Encounter for screening for malignant neoplasm of colon: Secondary | ICD-10-CM | POA: Diagnosis not present

## 2018-01-27 DIAGNOSIS — Z23 Encounter for immunization: Secondary | ICD-10-CM

## 2018-01-27 DIAGNOSIS — G44209 Tension-type headache, unspecified, not intractable: Secondary | ICD-10-CM

## 2018-01-27 DIAGNOSIS — Z0000 Encounter for general adult medical examination without abnormal findings: Secondary | ICD-10-CM

## 2018-01-27 DIAGNOSIS — Z0001 Encounter for general adult medical examination with abnormal findings: Secondary | ICD-10-CM | POA: Diagnosis not present

## 2018-01-27 DIAGNOSIS — K5900 Constipation, unspecified: Secondary | ICD-10-CM

## 2018-01-27 MED ORDER — METHYLPREDNISOLONE 4 MG PO TBPK: ORAL_TABLET | ORAL | 0 refills | Status: DC

## 2018-01-27 NOTE — Patient Instructions (Addendum)
Try MiraLAX 1-2 times daily over the next 3-4 days. If no improvement, try using an enema. Stay well hydrated and keep lots of fiber in your diet.  Stay well hydrated and eat cleanly.  If you do not hear anything about your referral in the next 1-2 weeks, call our office and ask for an update.  Let us know if you need anything.

## 2018-01-27 NOTE — Progress Notes (Signed)
Chief Complaint  Patient presents with  . Annual Exam    Well Male Timothy Blankenship is here for a complete physical.  Here w aid of [place] interpreter.  His last physical was >1 year ago.  Current diet: in general, a "healthy" diet   Current exercise: physically active at home Weight trend: stable Does pt snore? No. Seat belt? Yes.    Health maintenance Tetanus- Yes Pneumonia vaccine- Yes, needs PCV23   1 week of constipation. His last BM was yesterday, hard and small feces. Has not tried anything at home. Still eating and drinking normally, getting fruits and veggies. No pain or incomplete emptying.  HA for 1 mo on L. States it has been constant. Denies fevers, vision changes, balance issues, numbness/tingling, weakness, N/V. Had similar issue 3 yrs ago and was successfully tx'd with a Medrol Dosepak. He does have some L sided neck pain.   Past Medical History:  Diagnosis Date  . High cholesterol   . Hypertension   . Vascular abnormality    Treated    Past Surgical History:  Procedure Laterality Date  . TOOTH EXTRACTION     Full Dentures   Medications  Current Outpatient Medications on File Prior to Visit  Medication Sig Dispense Refill  . hydrochlorothiazide (HYDRODIURIL) 12.5 MG tablet Take 1 tablet (12.5 mg total) by mouth daily. 90 tablet 3  . losartan (COZAAR) 100 MG tablet Take 1 tablet (100 mg total) by mouth daily. 90 tablet 3  . docusate sodium (COLACE) 100 MG capsule Take 100 mg by mouth daily as needed for mild constipation.    . Multiple Vitamin (MULTIVITAMIN) tablet Take 1 tablet by mouth daily.    . Omega-3 Fatty Acids (FISH OIL) 1200 MG CAPS Take 1,200 mg by mouth daily.     Allergies No Known Allergies Family History Family History  Problem Relation Age of Onset  . Healthy Mother   . Healthy Father   . Healthy Brother        x5  . Healthy Sister        x1  . Healthy Son        x2  . Healthy Daughter        x1    Review of  Systems: Constitutional:  no fevers or chills Eye:  no recent significant change in vision Ear/Nose/Mouth/Throat:  Ears:  no tinnitus or hearing loss Nose/Mouth/Throat:  no complaints of nasal congestion or bleeding, no sore throat and oral sores Cardiovascular:  no chest pain, no palpitations Respiratory:  no cough and no shortness of breath Gastrointestinal:  no abdominal pain, no change in bowel habits, no nausea, vomiting, diarrhea, +constipation; no black or bloody stool GU:  Male: negative for dysuria, frequency, and incontinence and negative for prostate symptoms Musculoskeletal/Extremities:  no pain, redness, or swelling of the joints Integumentary (Skin):  no abnormal skin lesions reported Neurologic:  +headache, no numbness, tingling Endocrine:  No unexpected weight changes Hematologic/Lymphatic:  no night sweats  Exam BP 118/70 (BP Location: Left Arm, Patient Position: Sitting, Cuff Size: Small)   Pulse 62   Temp (!) 97.5 F (36.4 C) (Oral)   Ht 5\' 3"  (1.6 m)   Wt 137 lb 6 oz (62.3 kg)   SpO2 95%   BMI 24.33 kg/m  General:  well developed, well nourished, in no apparent distress Skin:  no significant moles, warts, or growths Head:  no masses, lesions, or tenderness Eyes:  pupils equal and round, sclera anicteric without injection Ears:  canals without lesions, TMs shiny without retraction, no obvious effusion, no erythema Nose:  nares patent, septum midline, mucosa normal Throat/Pharynx:  lips and gingiva without lesion; tongue and uvula midline; non-inflamed pharynx; no exudates or postnasal drainage Neck: neck supple without adenopathy, thyromegaly, or masses Lungs:  clear to auscultation, breath sounds equal bilaterally, no respiratory distress Cardio:  regular rate and rhythm without murmurs, heart sounds without clicks or rubs Abdomen:  abdomen soft, nontender; bowel sounds normal; no masses or organomegaly Genital (male): Uncircumcised penis, no lesions or  discharge; testes present bilaterally without masses or tenderness Rectal: Deferred Musculoskeletal: +TTP over L sided subocc triangle msc group; 5/5 strength throughout; symmetrical muscle groups noted without atrophy or deformity Extremities:  no clubbing, cyanosis, or edema, no deformities, no skin discoloration Neuro:  gait normal; deep tendon reflexes normal and symmetric Psych: well oriented with normal range of affect and appropriate judgment/insight  Assessment and Plan  Well adult exam - Plan: Comprehensive metabolic panel, Lipid panel  Constipation, unspecified constipation type  Acute non intractable tension-type headache - Plan: methylPREDNISolone (MEDROL DOSEPAK) 4 MG TBPK tablet  Screen for colon cancer - Plan: Ambulatory referral to Gastroenterology   Well 77 y.o. male. Counseled on diet and exercise. Other orders as above. For BM's, Miralax 1-2 times/d for 3 days then enema if no improvement.  Repeat Dosepak from 3 yrs earlier when he had success.  Discussed age range for ccs, he wishes to have this again and to be referred.  Follow up in in 6 mo or prn.  The patient voiced understanding and agreement to the plan.  Jilda Roche [place], DO 01/27/18 9:30 AM

## 2018-01-27 NOTE — Addendum Note (Signed)
Addended by: Scharlene GlossEWING, ROBIN B on: 01/27/2018 09:39 AM   Modules accepted: Orders

## 2018-01-27 NOTE — Progress Notes (Signed)
Pre visit review using our clinic review tool, if applicable. No additional management support is needed unless otherwise documented below in the visit note. 

## 2018-03-04 ENCOUNTER — Other Ambulatory Visit: Payer: Self-pay

## 2018-03-04 ENCOUNTER — Encounter: Payer: Self-pay | Admitting: Family Medicine

## 2018-03-04 ENCOUNTER — Ambulatory Visit (AMBULATORY_SURGERY_CENTER): Payer: Self-pay | Admitting: *Deleted

## 2018-03-04 VITALS — Ht 63.0 in | Wt 139.4 lb

## 2018-03-04 DIAGNOSIS — Z860100 Personal history of colon polyps, unspecified: Secondary | ICD-10-CM

## 2018-03-04 NOTE — Progress Notes (Signed)
No egg or soy allergy  No diet medications taken  No trouble moving neck  No home oxygen or hx of sleep apnea  Interpreter- Threasa BeardsYang [facility] here to translate PV  After PV completed, daughter enters the room and states she cannot bring him on his scheduled day.  About 10 minutes spent with daughter to try to get an acceptable day.  Rescheduled for 04-26-18 at 4:00.  New instructions given and reviewed with pt

## 2018-03-08 NOTE — Telephone Encounter (Signed)
Patient had cpe on 01/27/18, labs were placed but not collected. Please advise.

## 2018-03-10 ENCOUNTER — Other Ambulatory Visit (INDEPENDENT_AMBULATORY_CARE_PROVIDER_SITE_OTHER): Payer: Medicare Other

## 2018-03-10 DIAGNOSIS — Z0000 Encounter for general adult medical examination without abnormal findings: Secondary | ICD-10-CM

## 2018-03-10 LAB — COMPREHENSIVE METABOLIC PANEL
ALK PHOS: 47 U/L (ref 39–117)
ALT: 18 U/L (ref 0–53)
AST: 25 U/L (ref 0–37)
Albumin: 4.5 g/dL (ref 3.5–5.2)
BUN: 24 mg/dL — ABNORMAL HIGH (ref 6–23)
CALCIUM: 9.8 mg/dL (ref 8.4–10.5)
CO2: 29 mEq/L (ref 19–32)
Chloride: 102 mEq/L (ref 96–112)
Creatinine, Ser: 1.18 mg/dL (ref 0.40–1.50)
GFR: 63.69 mL/min (ref >60.00)
GLUCOSE: 99 mg/dL (ref 70–99)
POTASSIUM: 3.9 meq/L (ref 3.5–5.1)
Sodium: 139 mEq/L (ref 135–145)
TOTAL PROTEIN: 7.5 g/dL (ref 6.0–8.3)
Total Bilirubin: 0.5 mg/dL (ref 0.2–1.2)

## 2018-03-10 LAB — LIPID PANEL
Cholesterol: 165 mg/dL (ref 0–200)
HDL: 41.3 mg/dL (ref >39.00)
LDL Cholesterol: 84 mg/dL (ref 0–99)
NonHDL: 123.2
TRIGLYCERIDES: 194 mg/dL — AB (ref 0.0–149.0)
Total CHOL/HDL Ratio: 4
VLDL: 38.8 mg/dL (ref 0.0–40.0)

## 2018-03-17 ENCOUNTER — Ambulatory Visit (AMBULATORY_SURGERY_CENTER): Payer: Medicare Other | Admitting: Internal Medicine

## 2018-03-17 ENCOUNTER — Other Ambulatory Visit: Payer: Self-pay

## 2018-03-17 ENCOUNTER — Encounter: Payer: Self-pay | Admitting: Internal Medicine

## 2018-03-17 ENCOUNTER — Encounter: Payer: Medicare Other | Admitting: Internal Medicine

## 2018-03-17 VITALS — BP 102/50 | HR 55 | Temp 99.1°F | Resp 16 | Ht 63.0 in | Wt 139.0 lb

## 2018-03-17 DIAGNOSIS — Z86010 Personal history of colonic polyps: Secondary | ICD-10-CM

## 2018-03-17 MED ORDER — SODIUM CHLORIDE 0.9 % IV SOLN: 500.0000 mL | Freq: Once | INTRAVENOUS | Status: DC

## 2018-03-17 NOTE — Patient Instructions (Addendum)
   No polyps or cancer. No need for routine repeat colonoscopy.  I appreciate the opportunity to care for you. Iva Boop, MD, FACG  YOU HAD AN ENDOSCOPIC PROCEDURE TODAY AT THE [facility]:   Refer to the procedure report that was given to you for any specific questions about what was found during the examination.  If the procedure report does not answer your questions, please call your gastroenterologist to clarify.  If you requested that your care partner not be given the details of your procedure findings, then the procedure report has been included in a sealed envelope for you to review at your convenience later.  YOU SHOULD EXPECT: Some feelings of bloating in the abdomen. Passage of more gas than usual.  Walking can help get rid of the air that was put into your GI tract during the procedure and reduce the bloating. If you had a lower endoscopy (such as a colonoscopy or flexible sigmoidoscopy) you may notice spotting of blood in your stool or on the toilet paper. If you underwent a bowel prep for your procedure, you may not have a normal bowel movement for a few days.  Please Note:  You might notice some irritation and congestion in your nose or some drainage.  This is from the oxygen used during your procedure.  There is no need for concern and it should clear up in a day or so.  SYMPTOMS TO REPORT IMMEDIATELY:   Following lower endoscopy (colonoscopy or flexible sigmoidoscopy):  Excessive amounts of blood in the stool  Significant tenderness or worsening of abdominal pains  Swelling of the abdomen that is new, acute  Fever of 100F or higher  For urgent or emergent issues, a gastroenterologist can be reached at any hour by calling [phone].   DIET:  We do recommend a small meal at first, but then you may proceed to your regular diet.  Drink plenty of fluids but you should avoid alcoholic beverages for 24 hours.  ACTIVITY:  You should plan to take it  easy for the rest of today and you should NOT DRIVE or use heavy machinery until tomorrow (because of the sedation medicines used during the test).    FOLLOW UP: Our staff will call the number listed on your records the next business day following your procedure to check on you and address any questions or concerns that you may have regarding the information given to you following your procedure. If we do not reach you, we will leave a message.  However, if you are feeling well and you are not experiencing any problems, there is no need to return our call.  We will assume that you have returned to your regular daily activities without incident.  If any biopsies were taken you will be contacted by phone or by letter within the next 1-3 weeks.  Please call us at [phone] if you have not heard about the biopsies in 3 weeks.    SIGNATURES/CONFIDENTIALITY: You and/or your care partner have signed paperwork which will be entered into your electronic medical record.  These signatures attest to the fact that that the information above on your After Visit Summary has been reviewed and is understood.  Full responsibility of the confidentiality of this discharge information lies with you and/or your care-partner.

## 2018-03-17 NOTE — Progress Notes (Signed)
Pt's states no medical or surgical changes since previsit or office visit.Interpreter used today at the [facility] for this pt.  Interpreter's name is- [employer]

## 2018-03-17 NOTE — Op Note (Signed)
[facility] Patient Name: Timothy Blankenship Procedure Date: 03/17/2018 2:19 PM MRN: [id number] Endoscopist: Iva Boop , MD Age: 77 Referring MD:  Date of Birth: [birth year] Gender: Male Account #: [id number] Procedure:                Colonoscopy Indications:              High risk colon cancer surveillance: Personal                            history of colonic polyps Medicines:                Propofol per Anesthesia, Monitored Anesthesia Care Procedure:                Pre-Anesthesia Assessment:                           - Prior to the procedure, a History and Physical                            was performed, and patient medications and                            allergies were reviewed. The patient's tolerance of                            previous anesthesia was also reviewed. The risks                            and benefits of the procedure and the sedation                            options and risks were discussed with the patient.                            All questions were answered, and informed consent                            was obtained. Prior Anticoagulants: The patient has                            taken no previous anticoagulant or antiplatelet                            agents. ASA Grade Assessment: II - A patient with                            mild systemic disease. After reviewing the risks                            and benefits, the patient was deemed in                            satisfactory condition to undergo the procedure.  After obtaining informed consent, the colonoscope                            was passed under direct vision. Throughout the                            procedure, the patient's blood pressure, pulse, and                            oxygen saturations were monitored continuously. The                            Colonoscope was introduced through the anus and                            advanced to the the  cecum, identified by                            appendiceal orifice and ileocecal valve. The                            colonoscopy was performed without difficulty. The                            patient tolerated the procedure well. The quality                            of the bowel preparation was good. The bowel                            preparation used was Miralax. The ileocecal valve,                            appendiceal orifice, and rectum were photographed. Scope In: 2:31:50 PM Scope Out: 2:45:48 PM Scope Withdrawal Time: 0 hours 9 minutes 54 seconds  Total Procedure Duration: 0 hours 13 minutes 58 seconds  Findings:                 The perianal and digital rectal examinations were                            normal. Pertinent negatives include normal prostate                            (size, shape, and consistency).                           The entire examined colon appeared normal on direct                            and retroflexion views. Complications:            No immediate complications. Estimated Blood Loss:     Estimated blood loss: none. Impression:               -  The entire examined colon is normal on direct and                            retroflexion views.                           - No specimens collected.                           - Personal history of colonic polyp - 1 mm polyp                            removed 2008. Recommendation:           - Patient has a contact number available for                            emergencies. The signs and symptoms of potential                            delayed complications were discussed with the                            patient. Return to normal activities tomorrow.                            Written discharge instructions were provided to the                            patient.                           - Resume previous diet.                           - Continue present medications.                           -  No repeat colonoscopy due to age. Iva Boop, MD 03/17/2018 2:51:43 PM This report has been signed electronically.

## 2018-03-18 ENCOUNTER — Telehealth: Payer: Self-pay

## 2018-03-18 NOTE — Telephone Encounter (Signed)
  Follow up Call-  Call back number 03/17/2018  Post procedure Call Back phone  # [phone]  Permission to leave phone message Yes  Some recent data might be hidden     Patient questions:  Do you have a fever, pain , or abdominal swelling? No. Pain Score  0 *  Have you tolerated food without any problems? Yes.    Have you been able to return to your normal activities? Yes.    Do you have any questions about your discharge instructions: Diet   No. Medications  No. Follow up visit  No.  Do you have questions or concerns about your Care? No.  Actions: * If pain score is 4 or above: No action needed, pain <4.

## 2018-04-26 ENCOUNTER — Encounter: Payer: Medicare Other | Admitting: Internal Medicine

## 2018-06-16 ENCOUNTER — Encounter: Payer: Self-pay | Admitting: Family Medicine

## 2018-06-16 DIAGNOSIS — I10 Essential (primary) hypertension: Secondary | ICD-10-CM

## 2018-06-17 MED ORDER — LOSARTAN POTASSIUM 100 MG PO TABS: 100.0000 mg | ORAL_TABLET | Freq: Every day | ORAL | 0 refills | Status: DC

## 2018-06-17 MED ORDER — HYDROCHLOROTHIAZIDE 12.5 MG PO TABS: 12.5000 mg | ORAL_TABLET | Freq: Every day | ORAL | 0 refills | Status: DC

## 2018-06-29 ENCOUNTER — Ambulatory Visit: Payer: Medicare Other | Admitting: Family Medicine

## 2018-06-30 ENCOUNTER — Encounter: Payer: Self-pay | Admitting: Family Medicine

## 2018-06-30 ENCOUNTER — Ambulatory Visit (INDEPENDENT_AMBULATORY_CARE_PROVIDER_SITE_OTHER): Payer: Medicare Other | Admitting: Family Medicine

## 2018-06-30 VITALS — BP 122/72 | HR 78 | Temp 97.6°F | Ht 63.0 in | Wt 132.1 lb

## 2018-06-30 DIAGNOSIS — M79676 Pain in unspecified toe(s): Secondary | ICD-10-CM | POA: Diagnosis not present

## 2018-06-30 DIAGNOSIS — E782 Mixed hyperlipidemia: Secondary | ICD-10-CM

## 2018-06-30 DIAGNOSIS — I10 Essential (primary) hypertension: Secondary | ICD-10-CM

## 2018-06-30 NOTE — Patient Instructions (Addendum)
Stay active and keep the diet clean.  Give us 2-3 business days to get the results of your labs back.  Let us know if you need anything.  Foods to AVOID: Red meat, organ meat (liver), lunch meat, seafood (mussels, scallops, anchovies, etc) Alcohol Sugary foods/beverages (diet soft drinks have no link to flares)  Foods to migrate to: Dairy Vegetables Cherries have limited data to suggest they help lower uric acid levels (and prevent flares) Vit C (500 mg daily) may have a modest effect with preventing flares Poultry If you are going to eat red meat, beef and pork may give you less problems than lamb.

## 2018-06-30 NOTE — Progress Notes (Signed)
Pre visit review using our clinic review tool, if applicable. No additional management support is needed unless otherwise documented below in the visit note. 

## 2018-06-30 NOTE — Progress Notes (Signed)
Chief Complaint  Patient presents with  . Follow-up    Subjective Timothy Blankenship is a 77 y.o. male who presents for hypertension follow up. Here with aid of [place] interpreter. He does not routinely monitor home blood pressures. He is compliant with medications- losartan 100 mg/d, HCTZ 12.5 mg/d. Patient has these side effects of medication: none He is adhering to a "regular" diet overall. Current exercise: walking and gardening  TG's were elevated last check. Staying active. Diet is not much changed. No CP or SOB.  Hx of jt pain, concerned it may be gout. Requesting labs be checked regarding this.   Past Medical History:  Diagnosis Date  . High cholesterol   . Hypertension   . Vascular abnormality    Treated    Review of Systems Cardiovascular: no chest pain Respiratory:  no shortness of breath  Exam BP 122/72 (BP Location: Left Arm, Patient Position: Sitting, Cuff Size: Normal)   Pulse 78   Temp 97.6 F (36.4 C) (Oral)   Ht 5\' 3"  (1.6 m)   Wt 132 lb 2 oz (59.9 kg)   SpO2 97%   BMI 23.40 kg/m  General:  well developed, well nourished, in no apparent distress Heart: RRR, no bruits, no LE edema Lungs: clear to auscultation, no accessory muscle use Psych: well oriented with normal range of affect and appropriate judgment/insight  Essential hypertension - Plan: Lipid panel, Comprehensive metabolic panel, CANCELED: Comprehensive metabolic panel  Elevated triglycerides with high cholesterol - Plan: Lipid panel, Comprehensive metabolic panel, CANCELED: Lipid panel  Pain of toe, unspecified laterality - Plan: Uric acid, CANCELED: Uric acid  Orders as above. Counseled on diet and exercise. Gout food avs info provided. F/u in 7 mo for CPE. As soon as convenience for fasting labs.  The patient voiced understanding and agreement to the plan.  Jilda Rocheicholas Paul [place], DO 06/30/18  10:59 AM

## 2018-07-01 ENCOUNTER — Other Ambulatory Visit (INDEPENDENT_AMBULATORY_CARE_PROVIDER_SITE_OTHER): Payer: Medicare Other

## 2018-07-01 DIAGNOSIS — E782 Mixed hyperlipidemia: Secondary | ICD-10-CM | POA: Diagnosis not present

## 2018-07-01 DIAGNOSIS — M79676 Pain in unspecified toe(s): Secondary | ICD-10-CM

## 2018-07-01 DIAGNOSIS — I10 Essential (primary) hypertension: Secondary | ICD-10-CM | POA: Diagnosis not present

## 2018-07-01 LAB — LIPID PANEL
CHOLESTEROL: 164 mg/dL (ref 0–200)
HDL: 41.7 mg/dL (ref >39.00)
LDL Cholesterol: 87 mg/dL (ref 0–99)
NonHDL: 122.2
TRIGLYCERIDES: 175 mg/dL — AB (ref 0.0–149.0)
Total CHOL/HDL Ratio: 4
VLDL: 35 mg/dL (ref 0.0–40.0)

## 2018-07-01 LAB — COMPREHENSIVE METABOLIC PANEL
ALBUMIN: 4.5 g/dL (ref 3.5–5.2)
ALK PHOS: 46 U/L (ref 39–117)
ALT: 16 U/L (ref 0–53)
AST: 21 U/L (ref 0–37)
BILIRUBIN TOTAL: 0.7 mg/dL (ref 0.2–1.2)
BUN: 15 mg/dL (ref 6–23)
CALCIUM: 9.7 mg/dL (ref 8.4–10.5)
CO2: 29 mEq/L (ref 19–32)
CREATININE: 1.01 mg/dL (ref 0.40–1.50)
Chloride: 101 mEq/L (ref 96–112)
GFR: 76.15 mL/min (ref >60.00)
Glucose, Bld: 100 mg/dL — ABNORMAL HIGH (ref 70–99)
Potassium: 3.7 mEq/L (ref 3.5–5.1)
Sodium: 138 mEq/L (ref 135–145)
Total Protein: 7.4 g/dL (ref 6.0–8.3)

## 2018-07-01 LAB — URIC ACID: Uric Acid, Serum: 6.4 mg/dL (ref 4.0–7.8)

## 2018-09-15 DIAGNOSIS — Z23 Encounter for immunization: Secondary | ICD-10-CM | POA: Diagnosis not present

## 2018-09-19 ENCOUNTER — Other Ambulatory Visit: Payer: Self-pay | Admitting: Family Medicine

## 2018-09-19 DIAGNOSIS — I10 Essential (primary) hypertension: Secondary | ICD-10-CM

## 2018-12-01 ENCOUNTER — Ambulatory Visit (INDEPENDENT_AMBULATORY_CARE_PROVIDER_SITE_OTHER): Payer: Medicare Other | Admitting: Family Medicine

## 2018-12-01 ENCOUNTER — Other Ambulatory Visit: Payer: Self-pay | Admitting: Family Medicine

## 2018-12-01 ENCOUNTER — Encounter: Payer: Self-pay | Admitting: Family Medicine

## 2018-12-01 VITALS — BP 138/68 | HR 59 | Temp 98.1°F | Ht 63.0 in | Wt 137.0 lb

## 2018-12-01 DIAGNOSIS — Z7184 Encounter for health counseling related to travel: Secondary | ICD-10-CM | POA: Diagnosis not present

## 2018-12-01 DIAGNOSIS — I10 Essential (primary) hypertension: Secondary | ICD-10-CM

## 2018-12-01 DIAGNOSIS — Z23 Encounter for immunization: Secondary | ICD-10-CM

## 2018-12-01 MED ORDER — TYPHOID VACCINE PO CPDR: 1.0000 | DELAYED_RELEASE_CAPSULE | ORAL | 0 refills | Status: DC

## 2018-12-01 MED ORDER — HYDROCHLOROTHIAZIDE 12.5 MG PO TABS: ORAL_TABLET | ORAL | 0 refills | Status: DC

## 2018-12-01 MED ORDER — ATOVAQUONE-PROGUANIL HCL 250-100 MG PO TABS: ORAL_TABLET | ORAL | 0 refills | Status: DC

## 2018-12-01 MED ORDER — AZITHROMYCIN 500 MG PO TABS: ORAL_TABLET | ORAL | 0 refills | Status: DC

## 2018-12-01 MED ORDER — LOSARTAN POTASSIUM 100 MG PO TABS: ORAL_TABLET | ORAL | 0 refills | Status: DC

## 2018-12-01 MED ORDER — ONDANSETRON HCL 4 MG PO TABS: 4.0000 mg | ORAL_TABLET | Freq: Three times a day (TID) | ORAL | 0 refills | Status: DC | PRN

## 2018-12-01 NOTE — Progress Notes (Signed)
Chief Complaint  Patient presents with  . Follow-up    Subjective: Patient is a 78 y.o. male here for travel advice.  Travelling to [place]. [place]. Leaves on 12/19/2018. Needs Typhoid vaccine. Also requesting meds just in case anything arises while there.  Hypertension Patient presents for hypertension follow up. He is compliant with medications- losartan 100 mg/d, HCTZ 12.5 mg/d. Patient has these side effects of medication: none He is adhering to a healthy diet overall. Exercise: walking  ROS: Heart: Denies chest pain  Lungs: Denies SOB   Past Medical History:  Diagnosis Date  . High cholesterol   . Hypertension   . Vascular abnormality    Treated    Objective: BP 138/68 (BP Location: Left Arm, Patient Position: Sitting, Cuff Size: Normal)   Pulse (!) 59   Temp 98.1 F (36.7 C) (Oral)   Ht 5\' 3"  (1.6 m)   Wt 137 lb (62.1 kg)   SpO2 97%   BMI 24.27 kg/m  General: Awake, appears stated age HEENT: MMM, EOMi Heart: RRR, no bruits, no LE edema Lungs: CTAB, no rales, wheezes or rhonchi. No accessory muscle use Psych: Age appropriate judgment and insight, normal affect and mood  Assessment and Plan: Essential hypertension - Plan: hydrochlorothiazide (HYDRODIURIL) 12.5 MG tablet, losartan (COZAAR) 100 MG tablet  Travel advice encounter - Plan: ondansetron (ZOFRAN) 4 MG tablet, azithromycin (ZITHROMAX) 500 MG tablet, typhoid (VIVOTIF) DR capsule, atovaquone-proguanil (MALARONE) 250-100 MG TABS tablet  Cont current HTN meds. Counseled on diet and exercise. Zofran prn, azithromycin should he get traveler's diarrhea. Start Typhoid PO vaccine today. Hep A vaccine today. [place]. Start 2 d prior, cont for 30 d while there and cont for 7 d once back in [place].  F/u in 6 mo for CPE, cancel Mar appt.  The patient voiced understanding and agreement to the plan.  Jilda Roche [place], DO 12/01/18  1:38 PM

## 2018-12-01 NOTE — Patient Instructions (Addendum)
Because your blood pressure is well-controlled, you no longer have to check your blood pressure at home anymore unless you wish. Some people check it twice daily every day and some people stop altogether. Either or anything in between is fine. Strong work!  Keep the diet clean and stay active.  Safe travels.  Let [place] know if you need anything.

## 2018-12-01 NOTE — Addendum Note (Signed)
Addended by: Scharlene Gloss B on: 12/01/2018 01:52 PM   Modules accepted: Orders

## 2019-01-30 ENCOUNTER — Encounter: Payer: Medicare Other | Admitting: Family Medicine

## 2019-05-02 ENCOUNTER — Other Ambulatory Visit: Payer: Self-pay | Admitting: Family Medicine

## 2019-05-02 DIAGNOSIS — I10 Essential (primary) hypertension: Secondary | ICD-10-CM

## 2019-05-03 MED ORDER — LOSARTAN POTASSIUM 100 MG PO TABS: 100.0000 mg | ORAL_TABLET | Freq: Every day | ORAL | 0 refills | Status: DC

## 2019-05-03 MED ORDER — HYDROCHLOROTHIAZIDE 12.5 MG PO TABS: 12.5000 mg | ORAL_TABLET | Freq: Every day | ORAL | 0 refills | Status: DC

## 2019-06-01 ENCOUNTER — Encounter: Payer: Medicare Other | Admitting: Family Medicine

## 2019-07-27 ENCOUNTER — Other Ambulatory Visit: Payer: Self-pay | Admitting: Family Medicine

## 2019-07-27 DIAGNOSIS — I10 Essential (primary) hypertension: Secondary | ICD-10-CM

## 2019-07-27 MED ORDER — LOSARTAN POTASSIUM 100 MG PO TABS: 100.0000 mg | ORAL_TABLET | Freq: Every day | ORAL | 0 refills | Status: DC

## 2019-07-27 MED ORDER — HYDROCHLOROTHIAZIDE 12.5 MG PO TABS: 12.5000 mg | ORAL_TABLET | Freq: Every day | ORAL | 0 refills | Status: DC

## 2019-09-05 ENCOUNTER — Other Ambulatory Visit: Payer: Self-pay

## 2019-09-06 ENCOUNTER — Ambulatory Visit (INDEPENDENT_AMBULATORY_CARE_PROVIDER_SITE_OTHER): Payer: Medicare Other | Admitting: Family Medicine

## 2019-09-06 ENCOUNTER — Telehealth: Payer: Self-pay | Admitting: Family Medicine

## 2019-09-06 ENCOUNTER — Other Ambulatory Visit: Payer: Self-pay

## 2019-09-06 ENCOUNTER — Encounter: Payer: Self-pay | Admitting: Family Medicine

## 2019-09-06 DIAGNOSIS — Z0000 Encounter for general adult medical examination without abnormal findings: Secondary | ICD-10-CM

## 2019-09-06 NOTE — Progress Notes (Signed)
CC: Well visit  Well Male Timothy Blankenship is here for a complete physical.   His last physical was >1 year ago. Due to COVID-19 pandemic, we are interacting via web portal for an electronic face-to-face visit. I verified patient's ID using 2 identifiers. Patient agreed to proceed with visit via this method. Patient is in parked car, I am at office. Patient, child (who also interprets) and I are present for visit.  Current diet: in general, a "healthy" diet.   Current exercise: walking Weight trend: stable Daytime fatigue? No. Seat belt? Yes.    Health maintenance Shingrix- No Tetanus- Yes Pneumonia vaccine- Yes AAA screening- Yes  Past Medical History:  Diagnosis Date  . High cholesterol   . Hypertension   . Vascular abnormality    Treated     Past Surgical History:  Procedure Laterality Date  . TOOTH EXTRACTION     Full Dentures    Medications  Current Outpatient Medications on File Prior to Visit  Medication Sig Dispense Refill  . atovaquone-proguanil (MALARONE) 250-100 MG TABS tablet Start 2 days before departure and take daily. Continue for 7 days after you get back. 39 tablet 0  . azithromycin (ZITHROMAX) 500 MG tablet Take 1 tab daily for 3 days in event of pneumonia or traveller's diarrhea. 3 tablet 0  . docusate sodium (COLACE) 100 MG capsule Take 100 mg by mouth daily as needed for mild constipation.    . hydrochlorothiazide (HYDRODIURIL) 12.5 MG tablet Take 1 tablet (12.5 mg total) by mouth daily. 90 tablet 0  . losartan (COZAAR) 100 MG tablet Take 1 tablet (100 mg total) by mouth daily. 90 tablet 0  . Multiple Vitamin (MULTIVITAMIN) tablet Take 1 tablet by mouth daily.    . Omega-3 Fatty Acids (FISH OIL) 1200 MG CAPS Take 1,200 mg by mouth daily.    . ondansetron (ZOFRAN) 4 MG tablet Take 1 tablet (4 mg total) by mouth every 8 (eight) hours as needed. 20 tablet 0  . typhoid (VIVOTIF) DR capsule Take 1 capsule by mouth every other day. 4 capsule 0   Allergies No Known  Allergies  Family History Family History  Problem Relation Age of Onset  . Healthy Mother   . Healthy Father   . Healthy Brother        x5  . Healthy Sister        x1  . Healthy Son        x2  . Healthy Daughter        x1  . Colon cancer Neg Hx   . Esophageal cancer Neg Hx   . Rectal cancer Neg Hx   . Stomach cancer Neg Hx     Review of Systems: Constitutional:  no fevers or chills Eye:  no recent significant change in vision Ear/Nose/Mouth/Throat:  Ears:  no recent hearing loss Nose/Mouth/Throat:  no complaints of nasal congestion or sore throat Cardiovascular:  no chest pain Respiratory:  no shortness of breath Gastrointestinal:  no abdominal pain, +diarrhea GU:  Male: negative for dysuria, frequency, and incontinence  Musculoskeletal/Extremities:  no pain of the joints Integumentary (Skin):  no abnormal skin lesions reported Neurologic:  no headaches, Endocrine:  No unexpected weight changes Hematologic/Lymphatic:  no areas of easy bruising  Exam No conversational dyspnea Age appropriate judgment and insight Nml affect and mood  Assessment and Plan  Well adult exam - Plan: CBC, Comprehensive metabolic panel, Lipid panel   Well 78 y.o. male. Counseled on diet and exercise. Other  orders as above. Follow up in 6 mo.  The patient, through his daughter, voiced understanding and agreement to the plan.  [place], DO 09/06/19 8:46 AM

## 2019-09-06 NOTE — Telephone Encounter (Signed)
The patients son called in this morning to clarify patients symptoms communicated at check in as diarrhea. The patient does not have diarrhea as daughter stated as check in he had. The son states for the past year the patient has had difficulty with BM's, when does come is a little watery then mostly hard/small pieces.  With walking seems to bring it on and the pain starts and sometimes has a hard time controlling and can have accidents.  PCP has told him in the past to drink more water/and do fiber. The son called to clarify symptoms as was checked in by his daughter and she does not communicate [place] well,

## 2019-09-06 NOTE — Telephone Encounter (Signed)
Sorry for inconvenience. He can come in for labs sooner. Would he like to see a specialist for the bowel issues? I did send a message regarding visit info we did discuss today. Ty.

## 2019-09-06 NOTE — Telephone Encounter (Signed)
Called and spoke to the son;scheduled lab appt for tomorrow. He would like you to refer him to a specialist

## 2019-09-07 ENCOUNTER — Other Ambulatory Visit: Payer: Self-pay

## 2019-09-07 ENCOUNTER — Other Ambulatory Visit (INDEPENDENT_AMBULATORY_CARE_PROVIDER_SITE_OTHER): Payer: Medicare Other

## 2019-09-07 DIAGNOSIS — Z0000 Encounter for general adult medical examination without abnormal findings: Secondary | ICD-10-CM | POA: Diagnosis not present

## 2019-09-07 LAB — LDL CHOLESTEROL, DIRECT: Direct LDL: 91 mg/dL

## 2019-09-07 LAB — COMPREHENSIVE METABOLIC PANEL
ALT: 17 U/L (ref 0–53)
AST: 23 U/L (ref 0–37)
Albumin: 4.8 g/dL (ref 3.5–5.2)
Alkaline Phosphatase: 57 U/L (ref 39–117)
BUN: 21 mg/dL (ref 6–23)
CO2: 29 mEq/L (ref 19–32)
Calcium: 9.6 mg/dL (ref 8.4–10.5)
Chloride: 102 mEq/L (ref 96–112)
Creatinine, Ser: 1.09 mg/dL (ref 0.40–1.50)
GFR: 65.41 mL/min (ref >60.00)
Glucose, Bld: 101 mg/dL — ABNORMAL HIGH (ref 70–99)
Potassium: 3.7 mEq/L (ref 3.5–5.1)
Sodium: 139 mEq/L (ref 135–145)
Total Bilirubin: 0.7 mg/dL (ref 0.2–1.2)
Total Protein: 7.7 g/dL (ref 6.0–8.3)

## 2019-09-07 LAB — CBC
HCT: 43.7 % (ref 39.0–52.0)
Hemoglobin: 14.6 g/dL (ref 13.0–17.0)
MCHC: 33.3 g/dL (ref 30.0–36.0)
MCV: 86.5 fl (ref 78.0–100.0)
Platelets: 232 10*3/uL (ref 150.0–400.0)
RBC: 5.05 Mil/uL (ref 4.22–5.81)
RDW: 13.4 % (ref 11.5–15.5)
WBC: 7.5 10*3/uL (ref 4.0–10.5)

## 2019-09-07 LAB — LIPID PANEL
Cholesterol: 176 mg/dL (ref 0–200)
HDL: 37.8 mg/dL — ABNORMAL LOW (ref >39.00)
NonHDL: 137.85
Total CHOL/HDL Ratio: 5
Triglycerides: 222 mg/dL — ABNORMAL HIGH (ref 0.0–149.0)
VLDL: 44.4 mg/dL — ABNORMAL HIGH (ref 0.0–40.0)

## 2019-09-07 NOTE — Telephone Encounter (Signed)
MyChart message read by patient.

## 2019-09-08 ENCOUNTER — Other Ambulatory Visit: Payer: Self-pay | Admitting: Family Medicine

## 2019-09-08 DIAGNOSIS — E782 Mixed hyperlipidemia: Secondary | ICD-10-CM

## 2019-10-09 ENCOUNTER — Other Ambulatory Visit (INDEPENDENT_AMBULATORY_CARE_PROVIDER_SITE_OTHER): Payer: Medicare Other

## 2019-10-09 ENCOUNTER — Other Ambulatory Visit: Payer: Self-pay

## 2019-10-09 DIAGNOSIS — E782 Mixed hyperlipidemia: Secondary | ICD-10-CM | POA: Diagnosis not present

## 2019-10-09 LAB — LIPID PANEL
Cholesterol: 175 mg/dL (ref 0–200)
HDL: 39.1 mg/dL (ref >39.00)
NonHDL: 135.46
Total CHOL/HDL Ratio: 4
Triglycerides: 233 mg/dL — ABNORMAL HIGH (ref 0.0–149.0)
VLDL: 46.6 mg/dL — ABNORMAL HIGH (ref 0.0–40.0)

## 2019-10-09 LAB — LDL CHOLESTEROL, DIRECT: Direct LDL: 79 mg/dL

## 2019-10-25 ENCOUNTER — Other Ambulatory Visit: Payer: Self-pay | Admitting: Family Medicine

## 2019-10-25 DIAGNOSIS — I10 Essential (primary) hypertension: Secondary | ICD-10-CM

## 2019-10-25 MED ORDER — LOSARTAN POTASSIUM 100 MG PO TABS: 100.0000 mg | ORAL_TABLET | Freq: Every day | ORAL | 1 refills | Status: DC

## 2019-10-25 MED ORDER — HYDROCHLOROTHIAZIDE 12.5 MG PO TABS: 12.5000 mg | ORAL_TABLET | Freq: Every day | ORAL | 1 refills | Status: DC

## 2019-11-13 ENCOUNTER — Encounter: Payer: Self-pay | Admitting: Family Medicine

## 2020-04-22 ENCOUNTER — Other Ambulatory Visit: Payer: Self-pay | Admitting: Family Medicine

## 2020-04-22 DIAGNOSIS — I10 Essential (primary) hypertension: Secondary | ICD-10-CM

## 2020-10-19 ENCOUNTER — Other Ambulatory Visit: Payer: Self-pay | Admitting: Family Medicine

## 2020-10-19 DIAGNOSIS — I10 Essential (primary) hypertension: Secondary | ICD-10-CM

## 2020-12-17 ENCOUNTER — Ambulatory Visit: Payer: Medicare Other | Admitting: Family Medicine

## 2020-12-18 ENCOUNTER — Ambulatory Visit (INDEPENDENT_AMBULATORY_CARE_PROVIDER_SITE_OTHER): Payer: Medicare Other | Admitting: Family Medicine

## 2020-12-18 ENCOUNTER — Other Ambulatory Visit: Payer: Self-pay

## 2020-12-18 ENCOUNTER — Encounter: Payer: Self-pay | Admitting: Family Medicine

## 2020-12-18 ENCOUNTER — Other Ambulatory Visit: Payer: Self-pay | Admitting: Family Medicine

## 2020-12-18 VITALS — BP 140/72 | HR 81 | Temp 98.0°F | Ht 60.0 in | Wt 140.2 lb

## 2020-12-18 DIAGNOSIS — S46812A Strain of other muscles, fascia and tendons at shoulder and upper arm level, left arm, initial encounter: Secondary | ICD-10-CM | POA: Diagnosis not present

## 2020-12-18 DIAGNOSIS — E782 Mixed hyperlipidemia: Secondary | ICD-10-CM

## 2020-12-18 DIAGNOSIS — Z1159 Encounter for screening for other viral diseases: Secondary | ICD-10-CM | POA: Diagnosis not present

## 2020-12-18 DIAGNOSIS — Z1331 Encounter for screening for depression: Secondary | ICD-10-CM | POA: Diagnosis not present

## 2020-12-18 DIAGNOSIS — Z0000 Encounter for general adult medical examination without abnormal findings: Secondary | ICD-10-CM

## 2020-12-18 DIAGNOSIS — M7502 Adhesive capsulitis of left shoulder: Secondary | ICD-10-CM | POA: Diagnosis not present

## 2020-12-18 LAB — LDL CHOLESTEROL, DIRECT: Direct LDL: 80 mg/dL

## 2020-12-18 LAB — LIPID PANEL
Cholesterol: 173 mg/dL (ref 0–200)
HDL: 41.3 mg/dL (ref >39.00)
NonHDL: 131.5
Total CHOL/HDL Ratio: 4
Triglycerides: 293 mg/dL — ABNORMAL HIGH (ref 0.0–149.0)
VLDL: 58.6 mg/dL — ABNORMAL HIGH (ref 0.0–40.0)

## 2020-12-18 LAB — COMPREHENSIVE METABOLIC PANEL
ALT: 20 U/L (ref 0–53)
AST: 28 U/L (ref 0–37)
Albumin: 4.8 g/dL (ref 3.5–5.2)
Alkaline Phosphatase: 53 U/L (ref 39–117)
BUN: 19 mg/dL (ref 6–23)
CO2: 29 mEq/L (ref 19–32)
Calcium: 10.2 mg/dL (ref 8.4–10.5)
Chloride: 100 mEq/L (ref 96–112)
Creatinine, Ser: 1.07 mg/dL (ref 0.40–1.50)
GFR: 66.06 mL/min (ref >60.00)
Glucose, Bld: 95 mg/dL (ref 70–99)
Potassium: 3.7 mEq/L (ref 3.5–5.1)
Sodium: 139 mEq/L (ref 135–145)
Total Bilirubin: 0.5 mg/dL (ref 0.2–1.2)
Total Protein: 7.8 g/dL (ref 6.0–8.3)

## 2020-12-18 MED ORDER — ROSUVASTATIN CALCIUM 20 MG PO TABS: 20.0000 mg | ORAL_TABLET | Freq: Every day | ORAL | 3 refills | Status: DC

## 2020-12-18 NOTE — Patient Instructions (Addendum)
Give [place] 2-3 business days to get the results of your labs back.   Keep the diet clean and stay active.  Try to drink 55-60 oz of water daily outside of exercise.  Consider MiraLAX as needed for helping bowel movements.   Let us know if you need anything.  Trapezius stretches/exercises Do exercises exactly as told by your health care provider and adjust them as directed. It is normal to feel mild stretching, pulling, tightness, or discomfort as you do these exercises, but you should stop right away if you feel sudden pain or your pain gets worse.  Stretching and range of motion exercises These exercises warm up your muscles and joints and improve the movement and flexibility of your shoulder. These exercises can also help to relieve pain, numbness, and tingling. If you are unable to do any of the following for any reason, do not further attempt to do it.   Exercise A: Flexion, standing    1. Stand and hold a broomstick, a cane, or a similar object. Place your hands a little more than shoulder-width apart on the object. Your left / right hand should be palm-up, and your other hand should be palm-down. 2. Push the stick to raise your left / right arm out to your side and then over your head. Use your other hand to help move the stick. Stop when you feel a stretch in your shoulder, or when you reach the angle that is recommended by your health care provider. ? Avoid shrugging your shoulder while you raise your arm. Keep your shoulder blade tucked down toward your spine. 3. Hold for 30 seconds. 4. Slowly return to the starting position. Repeat 2 times. Complete this exercise 3 times per week.  Exercise B: Abduction, supine    1. Lie on your back and hold a broomstick, a cane, or a similar object. Place your hands a little more than shoulder-width apart on the object. Your left / right hand should be palm-up, and your other hand should be palm-down. 2. Push the stick to raise your left /  right arm out to your side and then over your head. Use your other hand to help move the stick. Stop when you feel a stretch in your shoulder, or when you reach the angle that is recommended by your health care provider. ? Avoid shrugging your shoulder while you raise your arm. Keep your shoulder blade tucked down toward your spine. 3. Hold for 30 seconds. 4. Slowly return to the starting position. Repeat 2 times. Complete this exercise 3 times per week.  Exercise C: Flexion, active-assisted    1. Lie on your back. You may bend your knees for comfort. 2. Hold a broomstick, a cane, or a similar object. Place your hands about shoulder-width apart on the object. Your palms should face toward your feet. 3. Raise the stick and move your arms over your head and behind your head, toward the floor. Use your healthy arm to help your left / right arm move farther. Stop when you feel a gentle stretch in your shoulder, or when you reach the angle where your health care provider tells you to stop. 4. Hold for 30 seconds. 5. Slowly return to the starting position. Repeat 2 times. Complete this exercise 3 times per week.  Exercise D: External rotation and abduction    1. Stand in a door frame with one of your feet slightly in front of the other. This is called a staggered stance. 2.  Choose one of the following positions as told by your health care provider: ? Place your hands and forearms on the door frame above your head. ? Place your hands and forearms on the door frame at the height of your head. ? Place your hands on the door frame at the height of your elbows. 3. Slowly move your weight onto your front foot until you feel a stretch across your chest and in the front of your shoulders. Keep your head and chest upright and keep your abdominal muscles tight. 4. Hold for 30 seconds. 5. To release the stretch, shift your weight to your back foot. Repeat 2 times. Complete this stretch 3 times per  week.  Strengthening exercises These exercises build strength and endurance in your shoulder. Endurance is the ability to use your muscles for a long time, even after your muscles get tired. Exercise E: Scapular depression and adduction  1. Sit on a stable chair. Support your arms in front of you with pillows, armrests, or a tabletop. Keep your elbows in line with the sides of your body. 2. Gently move your shoulder blades down toward your middle back. Relax the muscles on the tops of your shoulders and in the back of your neck. 3. Hold for 3 seconds. 4. Slowly release the tension and relax your muscles completely before doing this exercise again. Repeat for a total of 10 repetitions. 5. After you have practiced this exercise, try doing the exercise without the arm support. Then, try the exercise while standing instead of sitting. Repeat 2 times. Complete this exercise 3 times per week.  Exercise F: Shoulder abduction, isometric    1. Stand or sit about 4-6 inches (10-15 cm) from a wall with your left / right side facing the wall. 2. Bend your left / right elbow and gently press your elbow against the wall. 3. Increase the pressure slowly until you are pressing as hard as you can without shrugging your shoulder. 4. Hold for 3 seconds. 5. Slowly release the tension and relax your muscles completely. Repeat for a total of 10 repetitions. Repeat 2 times. Complete this exercise 3 times per week.  Exercise G: Shoulder flexion, isometric    1. Stand or sit about 4-6 inches (10-15 cm) away from a wall with your left / right side facing the wall. 2. Keep your left / right elbow straight and gently press the top of your fist against the wall. Increase the pressure slowly until you are pressing as hard as you can without shrugging your shoulder. 3. Hold for 10-15 seconds. 4. Slowly release the tension and relax your muscles completely. Repeat for a total of 10 repetitions. Repeat 2 times.  Complete this exercise 3 times per week.  Exercise H: Internal rotation    1. Sit in a stable chair without armrests, or stand. Secure an exercise band at your left / right side, at elbow height. 2. Place a soft object, such as a folded towel or a small pillow, under your left / right upper arm so your elbow is a few inches (about 8 cm) away from your side. 3. Hold the end of the exercise band so the band stretches. 4. Keeping your elbow pressed against the soft object under your arm, move your forearm across your body toward your abdomen. Keep your body steady so the movement is only coming from your shoulder. 5. Hold for 3 seconds. 6. Slowly return to the starting position. Repeat for a total of 10  repetitions. Repeat 2 times. Complete this exercise 3 times per week.  Exercise I: External rotation    1. Sit in a stable chair without armrests, or stand. 2. Secure an exercise band at your left / right side, at elbow height. 3. Place a soft object, such as a folded towel or a small pillow, under your left / right upper arm so your elbow is a few inches (about 8 cm) away from your side. 4. Hold the end of the exercise band so the band stretches. 5. Keeping your elbow pressed against the soft object under your arm, move your forearm out, away from your abdomen. Keep your body steady so the movement is only coming from your shoulder. 6. Hold for 3 seconds. 7. Slowly return to the starting position. Repeat for a total of 10 repetitions. Repeat 2 times. Complete this exercise 3 times per week. Exercise J: Shoulder extension  1. Sit in a stable chair without armrests, or stand. Secure an exercise band to a stable object in front of you so the band is at shoulder height. 2. Hold one end of the exercise band in each hand. Your palms should face each other. 3. Straighten your elbows and lift your hands up to shoulder height. 4. Step back, away from the secured end of the exercise band, until the  band stretches. 5. Squeeze your shoulder blades together and pull your hands down to the sides of your thighs. Stop when your hands are straight down by your sides. Do not let your hands go behind your body. 6. Hold for 3 seconds. 7. Slowly return to the starting position. Repeat for a total of 10 repetitions. Repeat 2 times. Complete this exercise 3 times per week.  Exercise K: Shoulder extension, prone    1. Lie on your abdomen on a firm surface so your left / right arm hangs over the edge. 2. Hold a 5 lb weight in your hand so your palm faces in toward your body. Your arm should be straight. 3. Squeeze your shoulder blade down toward the middle of your back. 4. Slowly raise your arm behind you, up to the height of the surface that you are lying on. Keep your arm straight. 5. Hold for 3 seconds. 6. Slowly return to the starting position and relax your muscles. Repeat for a total of 10 repetitions. Repeat 2 times. Complete this exercise 3 times per week.   Exercise L: Horizontal abduction, prone  1. Lie on your abdomen on a firm surface so your left / right arm hangs over the edge. 2. Hold a 5 lb weight in your hand so your palm faces toward your feet. Your arm should be straight. 3. Squeeze your shoulder blade down toward the middle of your back. 4. Bend your elbow so your hand moves up, until your elbow is bent to an "L" shape (90 degrees). With your elbow bent, slowly move your forearm forward and up. Raise your hand up to the height of the surface that you are lying on. ? Your upper arm should not move, and your elbow should stay bent. ? At the top of the movement, your palm should face the floor. 5. Hold for 3 seconds. 6. Slowly return to the starting position and relax your muscles. Repeat for a total of 10 repetitions. Repeat 2 times. Complete this exercise 3 times per week.  Exercise M: Horizontal abduction, standing  1. Sit on a stable chair, or stand. 2. Secure an exercise  band to a stable object in front of you so the band is at shoulder height. 3. Hold one end of the exercise band in each hand. 4. Straighten your elbows and lift your hands straight in front of you, up to shoulder height. Your palms should face down, toward the floor. 5. Step back, away from the secured end of the exercise band, until the band stretches. 6. Move your arms out to your sides, and keep your arms straight. 7. Hold for 3 seconds. 8. Slowly return to the starting position. Repeat for a total of 10 repetitions. Repeat 2 times. Complete this exercise 3 times per week.  Exercise N: Scapular retraction and elevation  1. Sit on a stable chair, or stand. 2. Secure an exercise band to a stable object in front of you so the band is at shoulder height. 3. Hold one end of the exercise band in each hand. Your palms should face each other. 4. Sit in a stable chair without armrests, or stand. 5. Step back, away from the secured end of the exercise band, until the band stretches. 6. Squeeze your shoulder blades together and lift your hands over your head. Keep your elbows straight. 7. Hold for 3 seconds. 8. Slowly return to the starting position. Repeat for a total of 10 repetitions. Repeat 2 times. Complete this exercise 3 times per week.  This information is not intended to replace advice given to you by your health care provider. Make sure you discuss any questions you have with your health care provider. Document Released: 10/26/2005 Document Revised: 07/02/2016 Document Reviewed: 09/12/2015 [employer] Interactive Patient Education  2017 [employer]  EXERCISES  RANGE OF MOTION (ROM) AND STRETCHING EXERCISES These exercises may help you when beginning to rehabilitate your injury. While completing these exercises, remember:   Restoring tissue flexibility helps normal motion to return to the joints. This allows healthier, less painful movement and activity.  An effective stretch should be  held for at least 30 seconds.  A stretch should never be painful. You should only feel a gentle lengthening or release in the stretched tissue.  ROM - Pendulum  Bend at the waist so that your right / left arm falls away from your body. Support yourself with your opposite hand on a solid surface, such as a table or a countertop.  Your right / left arm should be perpendicular to the ground. If it is not perpendicular, you need to lean over farther. Relax the muscles in your right / left arm and shoulder as much as possible.  Gently sway your hips and trunk so they move your right / left arm without any use of your right / left shoulder muscles.  Progress your movements so that your right / left arm moves side to side, then forward and backward, and finally, both clockwise and counterclockwise.  Complete 10-15 repetitions in each direction. Many people use this exercise to relieve discomfort in their shoulder as well as to gain range of motion. Repeat 2 times. Complete this exercise 3 times per week.  STRETCH - Flexion, Standing  Stand with good posture. With an underhand grip on your right / left hand and an overhand grip on the opposite hand, grasp a broomstick or cane so that your hands are a little more than shoulder-width apart.  Keeping your right / left elbow straight and shoulder muscles relaxed, push the stick with your opposite hand to raise your right / left arm in front of your body and  then overhead. Raise your arm until you feel a stretch in your right / left shoulder, but before you have increased shoulder pain.  Try to avoid shrugging your right / left shoulder as your arm rises by keeping your shoulder blade tucked down and toward your mid-back spine. Hold 30 seconds.  Slowly return to the starting position. Repeat 2 times. Complete this exercise 3 times per week.  STRETCH - Internal Rotation  Place your right / left hand behind your back, palm-up.  Throw a towel or belt  over your opposite shoulder. Grasp the towel/belt with your right / left hand.  While keeping an upright posture, gently pull up on the towel/belt until you feel a stretch in the front of your right / left shoulder.  Avoid shrugging your right / left shoulder as your arm rises by keeping your shoulder blade tucked down and toward your mid-back spine.  Hold 30. Release the stretch by lowering your opposite hand. Repeat 2 times. Complete this exercise 3 times per week.  STRETCH - External Rotation and Abduction  Stagger your stance through a doorframe. It does not matter which foot is forward.  As instructed by your physician, physical therapist or athletic trainer, place your hands: ? And forearms above your head and on the door frame. ? And forearms at head-height and on the door frame. ? At elbow-height and on the door frame.  Keeping your head and chest upright and your stomach muscles tight to prevent over-extending your low-back, slowly shift your weight onto your front foot until you feel a stretch across your chest and/or in the front of your shoulders.  Hold 30 seconds. Shift your weight to your back foot to release the stretch. Repeat 2 times. Complete this stretch 3 times per week.   STRENGTHENING EXERCISES  These exercises may help you when beginning to rehabilitate your injury. They may resolve your symptoms with or without further involvement from your physician, physical therapist or athletic trainer. While completing these exercises, remember:   Muscles can gain both the endurance and the strength needed for everyday activities through controlled exercises.  Complete these exercises as instructed by your physician, physical therapist or athletic trainer. Progress the resistance and repetitions only as guided.  You may experience muscle soreness or fatigue, but the pain or discomfort you are trying to eliminate should never worsen during these exercises. If this pain does  worsen, stop and make certain you are following the directions exactly. If the pain is still present after adjustments, discontinue the exercise until you can discuss the trouble with your clinician.  If advised by your physician, during your recovery, avoid activity or exercises which involve actions that place your right / left hand or elbow above your head or behind your back or head. These positions stress the tissues which are trying to heal.  STRENGTH - Scapular Depression and Adduction  With good posture, sit on a firm chair. Supported your arms in front of you with pillows, arm rests or a table top. Have your elbows in line with the sides of your body.  Gently draw your shoulder blades down and toward your mid-back spine. Gradually increase the tension without tensing the muscles along the top of your shoulders and the back of your neck.  Hold for 3 seconds. Slowly release the tension and relax your muscles completely before completing the next repetition.  After you have practiced this exercise, remove the arm support and complete it in standing as well  as sitting. Repeat 2 times. Complete this exercise 3 times per week.   STRENGTH - External Rotators  Secure a rubber exercise band/tubing to a fixed object so that it is at the same height as your right / left elbow when you are standing or sitting on a firm surface.  Stand or sit so that the secured exercise band/tubing is at your side that is not injured.  Bend your elbow 90 degrees. Place a folded towel or small pillow under your right / left arm so that your elbow is a few inches away from your side.  Keeping the tension on the exercise band/tubing, pull it away from your body, as if pivoting on your elbow. Be sure to keep your body steady so that the movement is only coming from your shoulder rotating.  Hold 3 seconds. Release the tension in a controlled manner as you return to the starting position. Repeat 2 times. Complete  this exercise 3 times per week.   STRENGTH - Supraspinatus  Stand or sit with good posture. Grasp a 2-3 lb weight or an exercise band/tubing so that your hand is "thumbs-up," like when you shake hands.  Slowly lift your right / left hand from your thigh into the air, traveling about 30 degrees from straight out at your side. Lift your hand to shoulder height or as far as you can without increasing any shoulder pain. Initially, many people do not lift their hands above shoulder height.  Avoid shrugging your right / left shoulder as your arm rises by keeping your shoulder blade tucked down and toward your mid-back spine.  Hold for 3 seconds. Control the descent of your hand as you slowly return to your starting position. Repeat 2 times. Complete this exercise 3 times per week.   STRENGTH - Shoulder Extensors  Secure a rubber exercise band/tubing so that it is at the height of your shoulders when you are either standing or sitting on a firm arm-less chair.  With a thumbs-up grip, grasp an end of the band/tubing in each hand. Straighten your elbows and lift your hands straight in front of you at shoulder height. Step back away from the secured end of band/tubing until it becomes tense.  Squeezing your shoulder blades together, pull your hands down to the sides of your thighs. Do not allow your hands to go behind you.  Hold for 3 seconds. Slowly ease the tension on the band/tubing as you reverse the directions and return to the starting position. Repeat 2 times. Complete this exercise 3 times per week.   STRENGTH - Scapular Retractors  Secure a rubber exercise band/tubing so that it is at the height of your shoulders when you are either standing or sitting on a firm arm-less chair.  With a palm-down grip, grasp an end of the band/tubing in each hand. Straighten your elbows and lift your hands straight in front of you at shoulder height. Step back away from the secured end of band/tubing until  it becomes tense.  Squeezing your shoulder blades together, draw your elbows back as you bend them. Keep your upper arm lifted away from your body throughout the exercise.  Hold 3 seconds. Slowly ease the tension on the band/tubing as you reverse the directions and return to the starting position. Repeat 2 times. Complete this exercise 3 times per week.  STRENGTH - Scapular Depressors  Find a sturdy chair without wheels, such as a from a dining room table.  Keeping your feet on the  floor, lift your bottom from the seat and lock your elbows.  Keeping your elbows straight, allow gravity to pull your body weight down. Your shoulders will rise toward your ears.  Raise your body against gravity by drawing your shoulder blades down your back, shortening the distance between your shoulders and ears. Although your feet should always maintain contact with the floor, your feet should progressively support less body weight as you get stronger.  Hold 3 seconds. In a controlled and slow manner, lower your body weight to begin the next repetition. Repeat 2 times. Complete this exercise 3 times per week.   This information is not intended to replace advice given to you by your health care provider. Make sure you discuss any questions you have with your health care provider.  Document Released: 09/09/2005 Document Revised: 11/16/2014 Document Reviewed: 02/07/2009 [employer] Interactive Patient Education [employer]

## 2020-12-18 NOTE — Progress Notes (Signed)
Chief Complaint  Patient presents with  . Annual Exam  . Arm Pain    Left arm pain     Well Male Timothy Blankenship is here for a complete physical.   His last physical was >1 year ago.  Current diet: in general, a "normal" diet.   Current exercise: walking Weight trend: stable Fatigue out of ordinary? No. Seat belt? Yes.   No depression or anhedonia  Health maintenance Shingrix- No Tetanus- Yes Hep C- No Pneumonia vaccine- Yes   L arm pain Started 3 mo ago, no inj or change in activity. Spans the shoulder all the way down. No bruising, redness or swelling. Nml ROM. No neurologic s/s's. He has trouble laying flat. He has tried nothing so far. This is not getting better. Describes the pain as dull pain. No neck problems.   Past Medical History:  Diagnosis Date  . High cholesterol   . Hypertension   . Vascular abnormality    Treated     Past Surgical History:  Procedure Laterality Date  . TOOTH EXTRACTION     Full Dentures    Medications  Current Outpatient Medications on File Prior to Visit  Medication Sig Dispense Refill  . docusate sodium (COLACE) 100 MG capsule Take 100 mg by mouth daily as needed for mild constipation.    . hydrochlorothiazide (HYDRODIURIL) 12.5 MG tablet TAKE 1 TABLET(12.5 MG) BY MOUTH DAILY 90 tablet 1  . losartan (COZAAR) 100 MG tablet TAKE 1 TABLET(100 MG) BY MOUTH DAILY 90 tablet 1  . Multiple Vitamin (MULTIVITAMIN) tablet Take 1 tablet by mouth daily.    . Omega-3 Fatty Acids (FISH OIL) 1200 MG CAPS Take 1,200 mg by mouth daily.     Allergies No Known Allergies  Family History Family History  Problem Relation Age of Onset  . Healthy Mother   . Healthy Father   . Healthy Brother        x5  . Healthy Sister        x1  . Healthy Son        x2  . Healthy Daughter        x1  . Colon cancer Neg Hx   . Esophageal cancer Neg Hx   . Rectal cancer Neg Hx   . Stomach cancer Neg Hx     Review of Systems: Constitutional:  no fevers Eye:  no  recent significant change in vision Ears:  No changes in hearing Nose/Mouth/Throat:  no complaints of nasal congestion, no sore throat Cardiovascular: no chest pain Respiratory:  No shortness of breath Gastrointestinal:  +intermittent constipation GU:  No frequency Integumentary:  no abnormal skin lesions reported Neurologic:  no headaches MSK: +L arm pain Endocrine:  denies unexplained weight changes  Exam BP 140/72 (BP Location: Left Arm, Patient Position: Sitting, Cuff Size: Normal)   Pulse 81   Temp 98 F (36.7 C) (Oral)   Ht 5' (1.524 m)   Wt 140 lb 4 oz (63.6 kg)   SpO2 97%   BMI 27.39 kg/m  General:  well developed, well nourished, in no apparent distress Skin:  no significant moles, warts, or growths Head:  no masses, lesions, or tenderness Eyes:  pupils equal and round, sclera anicteric without injection Ears:  canals without lesions, TMs shiny without retraction, no obvious effusion, no erythema Nose:  nares patent, septum midline, mucosa normal Throat/Pharynx:  lips and gingiva without lesion; tongue and uvula midline; non-inflamed pharynx; no exudates or postnasal drainage Lungs:  clear  to auscultation, breath sounds equal bilaterally, no respiratory distress Cardio:  regular rate and rhythm, no LE edema or bruits Rectal: Deferred GI: BS+, S, NT, ND, no masses or organomegaly Musculoskeletal: +TTP over L trap; decreased active/passive ROM at L shoulder, +Neer's, Hawkins, Empty can; equiv Speed's, lift off, neg cross over Neuro:  gait normal; deep tendon reflexes normal and symmetric Psych: well oriented with normal range of affect and appropriate judgment/insight  Assessment and Plan  Well adult exam  Encounter for hepatitis C screening test for low risk patient - Plan: Hepatitis C antibody  Elevated triglycerides with high cholesterol - Plan: Lipid panel, Comprehensive metabolic panel  Strain of left trapezius muscle, initial encounter  Adhesive capsulitis  of left shoulder  Depression screening negative   Well 80 y.o. male. Counseled on diet and exercise. Other orders as above.  Stretches/exercises for trap and shoulder. Heat, ice, Tylenol. If no better, will inject in 1 mo and consider PT.  Otherwise follow up in 6 mo or prn.  The patient voiced understanding and agreement to the plan.  Jilda Roche [place], DO 12/18/20 9:44 AM

## 2020-12-19 LAB — HEPATITIS C ANTIBODY
Hepatitis C Ab: NONREACTIVE
SIGNAL TO CUT-OFF: 0.01 (ref <1.00)

## 2021-01-15 ENCOUNTER — Ambulatory Visit (INDEPENDENT_AMBULATORY_CARE_PROVIDER_SITE_OTHER): Payer: Medicare Other | Admitting: Family Medicine

## 2021-01-15 ENCOUNTER — Encounter: Payer: Self-pay | Admitting: Family Medicine

## 2021-01-15 ENCOUNTER — Other Ambulatory Visit: Payer: Self-pay

## 2021-01-15 VITALS — BP 132/70 | HR 62 | Temp 97.5°F | Resp 16 | Wt 138.0 lb

## 2021-01-15 DIAGNOSIS — M25511 Pain in right shoulder: Secondary | ICD-10-CM | POA: Diagnosis not present

## 2021-01-15 DIAGNOSIS — G8929 Other chronic pain: Secondary | ICD-10-CM | POA: Diagnosis not present

## 2021-01-15 DIAGNOSIS — M25561 Pain in right knee: Secondary | ICD-10-CM | POA: Diagnosis not present

## 2021-01-15 DIAGNOSIS — M7502 Adhesive capsulitis of left shoulder: Secondary | ICD-10-CM | POA: Diagnosis not present

## 2021-01-15 NOTE — Progress Notes (Signed)
Chief Complaint  Patient presents with  . recheck of the shoulder    Subjective: Patient is a 80 y.o. male here for f/u L arm pain. Here w aid of [name] interpreter.   Seen 1 mo ago and tx'd w stretches/exercises, heat, ice, Tylenol. He reports compliance. Reports approx 30% improvement. Still having some decreased ROM, it is improved. No redness, fevers, bruising, swelling. No neuro s/s's.    R knee pain for 1 mo. No inj or change in activity. No new footwear. No swelling, bruising, redness or decreased ROM. Has not tried anything at home. No neuro s/s's.   Past Medical History:  Diagnosis Date  . High cholesterol   . Hypertension   . Vascular abnormality    Treated    Objective: BP 132/70   Pulse 62   Temp (!) 97.5 F (36.4 C)   Resp 16   Wt 138 lb (62.6 kg)   SpO2 99%   BMI 26.95 kg/m  General: Awake, appears stated age MSK: Decreased active/passive ROM of the L shoulder, mild ttp over the trap, no other bony/msc ttp. +Neer's, Hawkins, Empty can, neg Speed's, lift off, cross over, Obriens R knee: +TTP over patellar tendon, poor hamstring rom, no effusion or deformity, nml ROM of knee, Neg Lachman's, varus/valgus stress, McMurray's Lungs: No accessory muscle use Psych: Age appropriate judgment and insight, normal affect and mood  Procedure Note; Shoulder bursa injection Verbal consent obtained. The area was palpated, an area was marked just caudal to the acromion process laterally, and cleaned with Betadine x1. A 25-gauge needle was used to enter the joint laterally with ease. 40 mg of Depomedrol with 2 mL of 1% lidocaine was injected. The patient tolerated the procedure well. There were no complications noted.  Assessment and Plan: Adhesive capsulitis of left shoulder - Plan: PR DRAIN/INJECT LARGE JOINT/BURSA  Chronic right shoulder pain - Plan: PR DRAIN/INJECT LARGE JOINT/BURSA  Acute pain of right knee  1./2. injection today. Cont HEP. PT if no better by  next mo.  3. Stretches/exercises, heat, ice, Tylenol. Consider imaging vs injection vs PT if no improvement.  The patient thru the interpreter voiced understanding and agreement to the plan.  Jilda Roche [place], DO 01/15/21  10:53 AM

## 2021-01-15 NOTE — Patient Instructions (Signed)
Ice/cold pack over area for 10-15 min twice daily.  Heat (pad or rice pillow in microwave) over affected area, 10-15 minutes twice daily.   OK to take Tylenol 1000 mg (2 extra strength tabs) or 975 mg (3 regular strength tabs) every 6 hours as needed.  Continue doing the stretches/exercises for your shoulder please.   Let us know if you need anything.  Stretching and range of motion exercises These exercises warm up your muscles and joints and improve the movement and flexibility of your knee. These exercises also help to relieve pain and stiffness.  Exercise A: Knee flexion, active 1. Lie on your back with both knees straight. If this causes back discomfort, bend your uninjured knee so your foot is flat on the floor. 2. Slowly slide your left / right heel back toward your buttocks until you feel a gentle stretch in the front of your knee or thigh. Stop if you have pain. 3. Hold for3 seconds. 4. Slowly slide your left / right heel back to the starting position. 10 total repetitions. Repeat 2 times. Complete this exercise 3 times a week.  Exercise B: Knee extension, sitting 1. Sit with your left / right heel propped on a chair, a coffee table, or a footstool. Do not have anything under your knee to support it. 2. Allow your leg muscles to relax, letting gravity straighten out your knee. You should feel a stretch behind your left / right knee. 3. If told by your health care provide just above your kneecap. 4. Hold this position for 3 seconds. 5. Repeat for a total of 10 repetitions. Repeat 2 times. Complete this stretch 3 times a week.  Strengthening exercises These exercises build strength and endurance in your knee. Endurance is the ability to use your muscles for a long time, even after they get tired.  Exercise C: Quadriceps, isometric 1. Lie on your back with your left / right leg extended and your other knee bent. Put a rolled towel or small pillow under your right/left knee if  told by your health care provider. 2. Slowly tense the muscles in the front of your left / right thigh by pushing the back of your knee down. You should see your knee cap slide up toward your hip or see increased dimpling just above the knee. 3. For 3 seconds, keep the muscle as tight as you can without increasing your pain. 4. Relax the muscles slowly and completely. Repeat for 10 total repetitions. Repeat 2 times. Complete this exercise 3 times a week. Exercise D: Straight leg raises (quadriceps) 1. Lie on your back with your left / right leg extended and your other knee bent. 2. Tense the muscles in the front of your left / right thigh. You should see your kneecap slide up or see increased dimpling just above the knee. 3. Keep these muscles tight as you raise your leg 4-6 inches (10-15 cm) off the floor. 4. Hold this position for 3 seconds. 5. Keep these muscles tense as you lower your leg. 6. Relax the muscles slowly and completely. Repeat for a total of 10 repetitions. Repeat 2 times. Complete this exercise 3 times a week.  Exercise E: Hamstring curls 1. On the floor or a bed, lie on your abdomen with your legs straight. Put a folded towel or small pillow under your left / right thigh, just above your kneecap. 2. Slowly bend your left / right knee as far as you can without pain. Keep your hips  flat against the floor or bed. 3. Hold this position for 3 seconds. 4. Slowly lower your leg to the starting position. Repeat for a total of 10 repetitions. Repeat 2 times. Complete this exercise 3 times per week.  Stretching exercises These exercises warm up your muscles and joints and improve the movement and flexibility of your knee. These exercises also help to relieve pain and stiffness.  Exercise A: Quadriceps, prone 1. Lie on your abdomen on a firm surface, such as a bed or padded floor. 2. Bend your left / right knee and hold your ankle. If you cannot reach your ankle or pant leg, loop a  belt around your foot and grab the belt instead. 3. Gently pull your heel toward your buttocks. Your knee should not slide out to the side. You should feel a stretch in the front of your thigh and knee. 4. Hold this position for 30 seconds. Repeat 2 times. Complete this stretch 3 times a week.  Exercise B: Hamstring, doorway 1. Lie on your back in front of a doorway with your left / right leg resting against the wall and your other leg flat on the floor in the doorway. There should be a slight bend in your left / right knee. 2. Straighten your left / right knee. You should feel a stretch behind your knee or thigh. If you do not feel that stretch, scoot your buttocks closer to the door. 3. Hold this position for 30 seconds. Repeat 2 times. Complete this stretch 3 times a week.  Strengthening exercises These exercises build strength and endurance in your knee and leg muscles. Endurance is the ability to use your muscles for a long time, even after they get tired.   Exercise D: Wall slides (quadriceps) 1. Lean your back against a smooth wall or door, and walk your feet out 18-24 inches (45-61 cm) from it. 2. Place your feet hip-width apart. 3. Slowly slide down the wall or door until your knees bend 90 degrees. Keep your knees over your heels, not over your toes. Keep your knees in line with your hips. 4. Hold for 2 seconds. 5. Stand up to rest for 60 seconds. Repeat 2 times. Complete this exercise 3 times a week.  Exercise E: Bridge (hip extensors) 1. Lie on your back on a firm surface with your knees bent and your feet flat on the floor. 2. Tighten your buttocks muscles and lift your bottom off the floor until your trunk is level with your thighs. ? Do not arch your back. ? You should feel the muscles working in your buttocks and the back of your thighs. 3. Hold this position for 2 seconds. 4. Slowly lower your hips to the starting position. 5. Let your buttocks muscles relax completely  between repetitions. Repeat 2 times. Complete this exercise 3 times a week.

## 2021-01-31 ENCOUNTER — Other Ambulatory Visit (INDEPENDENT_AMBULATORY_CARE_PROVIDER_SITE_OTHER): Payer: Medicare Other

## 2021-01-31 ENCOUNTER — Other Ambulatory Visit: Payer: Self-pay

## 2021-01-31 DIAGNOSIS — E782 Mixed hyperlipidemia: Secondary | ICD-10-CM | POA: Diagnosis not present

## 2021-01-31 LAB — HEPATIC FUNCTION PANEL
ALT: 15 U/L (ref 0–53)
AST: 26 U/L (ref 0–37)
Albumin: 4.7 g/dL (ref 3.5–5.2)
Alkaline Phosphatase: 47 U/L (ref 39–117)
Bilirubin, Direct: 0.2 mg/dL (ref 0.0–0.3)
Total Bilirubin: 0.9 mg/dL (ref 0.2–1.2)
Total Protein: 7.4 g/dL (ref 6.0–8.3)

## 2021-01-31 LAB — LIPID PANEL
Cholesterol: 107 mg/dL (ref 0–200)
HDL: 45 mg/dL (ref >39.00)
LDL Cholesterol: 42 mg/dL (ref 0–99)
NonHDL: 62.18
Total CHOL/HDL Ratio: 2
Triglycerides: 101 mg/dL (ref 0.0–149.0)
VLDL: 20.2 mg/dL (ref 0.0–40.0)

## 2021-02-17 ENCOUNTER — Ambulatory Visit (INDEPENDENT_AMBULATORY_CARE_PROVIDER_SITE_OTHER): Payer: Medicare Other | Admitting: Family Medicine

## 2021-02-17 ENCOUNTER — Other Ambulatory Visit: Payer: Self-pay

## 2021-02-17 ENCOUNTER — Encounter: Payer: Self-pay | Admitting: Family Medicine

## 2021-02-17 VITALS — BP 142/72 | HR 65 | Temp 98.5°F | Ht 60.0 in | Wt 136.5 lb

## 2021-02-17 DIAGNOSIS — M7502 Adhesive capsulitis of left shoulder: Secondary | ICD-10-CM

## 2021-02-17 DIAGNOSIS — Z09 Encounter for follow-up examination after completed treatment for conditions other than malignant neoplasm: Secondary | ICD-10-CM | POA: Diagnosis not present

## 2021-02-17 NOTE — Progress Notes (Signed)
Musculoskeletal Exam  Patient: Timothy Blankenship DOB: 11-17-1940  DOS: 02/17/2021  SUBJECTIVE:  Chief Complaint:   Chief Complaint  Patient presents with  . Follow-up    Timothy Blankenship is a 80 y.o.  male for evaluation and treatment of L shoulder pain. Here w aid of [name] interpreter.   Pt dx w adhesive capsulitis 1 mo ago and received injection. It worked well. ROM and pain around 50% improved which he is pleased with. No neuro s/s;s.   R knee pain also improved after HEP. No complaints.   Past Medical History:  Diagnosis Date  . High cholesterol   . Hypertension   . Vascular abnormality    Treated    Objective: VITAL SIGNS: BP (!) 142/72 (BP Location: Left Arm, Patient Position: Sitting, Cuff Size: Normal)   Pulse 65   Temp 98.5 F (36.9 C) (Oral)   Ht 5' (1.524 m)   Wt 136 lb 8 oz (61.9 kg)   SpO2 96%   BMI 26.66 kg/m  Constitutional: Well formed, well developed. No acute distress. Thorax & Lungs: No accessory muscle use Musculoskeletal: L shoulder.   Normal active range of motion: No.   Normal passive range of motion: no Tenderness to palpation: no Deformity: no Ecchymosis: no Tests positive: None Tests negative: Neer's elicited no pain; Speed's, lift off, cross over, Obrien's Neurologic: Normal sensory function. No focal deficits noted. DTR's equal and symmetric in UE's. No clonus. Psychiatric: Normal mood. Age appropriate judgment and insight. Alert & oriented x 3.    Assessment:  Adhesive capsulitis of left shoulder  Follow-up for resolved condition  Plan: 1. Improved. Cont stretches/exercises, heat, ice, Tylenol.  2. Resolved. Can stop doing stretches/exercises.  F/u in 4 mo for med ck. The patient through the interpreter voiced understanding and agreement to the plan.   Jilda Roche [place], DO 02/17/21  9:52 AM

## 2021-02-17 NOTE — Patient Instructions (Signed)
Continue the stretches/exercises for the shoulder.   Let us know if you need another injection (should be at least 3 months in between).   We can stop doing the stretches/exercises for the knee. You may need to do them 1-2 times per month.   Heat (pad or rice pillow in microwave) over affected area, 10-15 minutes twice daily.   Let us know if you need anything.

## 2021-05-08 ENCOUNTER — Other Ambulatory Visit: Payer: Self-pay | Admitting: Family Medicine

## 2021-05-08 DIAGNOSIS — I10 Essential (primary) hypertension: Secondary | ICD-10-CM

## 2021-05-27 ENCOUNTER — Ambulatory Visit
Admission: RE | Admit: 2021-05-27 | Discharge: 2021-05-27 | Disposition: A | Discharge status: Home / Self Care | Payer: Medicare Other | Source: Ambulatory Visit | Attending: Emergency Medicine | Admitting: Emergency Medicine

## 2021-05-27 ENCOUNTER — Other Ambulatory Visit: Payer: Self-pay

## 2021-05-27 ENCOUNTER — Telehealth: Payer: Medicare Other | Admitting: Internal Medicine

## 2021-05-27 VITALS — BP 160/85 | HR 77 | Temp 97.8°F | Resp 19

## 2021-05-27 DIAGNOSIS — R059 Cough, unspecified: Secondary | ICD-10-CM | POA: Diagnosis not present

## 2021-05-27 MED ORDER — CETIRIZINE HCL 10 MG PO CAPS: 10.0000 mg | ORAL_CAPSULE | Freq: Every day | ORAL | 0 refills | Status: DC

## 2021-05-27 MED ORDER — BENZONATATE 200 MG PO CAPS: 200.0000 mg | ORAL_CAPSULE | Freq: Three times a day (TID) | ORAL | 0 refills | Status: AC | PRN

## 2021-05-27 NOTE — Discharge Instructions (Addendum)
COVID test pending, we will call if positive Tessalon every 8 hours for cough Daily cetrizine to help with sore throat/post nasal drainage Tylenol and ibuprofen as needed Follow up if not improving over the next 4-5 days

## 2021-05-27 NOTE — ED Provider Notes (Signed)
[facility]    CSN: [id number] Arrival date & time: 05/27/21  1041      History   Chief Complaint Chief Complaint  Patient presents with   Cough   Sore Throat    HPI Timothy Blankenship is a 80 y.o. male history of hypertension, presenting today for evaluation of sore throat and cough.  Symptoms began 2 days ago.  Denies any fevers chills or body aches.  Wife here with similar symptoms.  Denies any chest pain or shortness of breath.  Using Robitussin without relief.  HPI  Past Medical History:  Diagnosis Date   High cholesterol    Hypertension    Vascular abnormality    Treated    Patient Active Problem List   Diagnosis Date Noted   Adhesive capsulitis of left shoulder 02/17/2021   Sebaceous cyst 07/03/2016   Elevated triglycerides with high cholesterol 11/25/2015   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 10/25/2015   Hereditary and idiopathic peripheral neuropathy 10/25/2015   Visit for preventive health examination 10/25/2015   CN (constipation) 05/28/2015   Cataracts, bilateral 09/11/2014   Screening for prostate cancer 09/11/2014   Screening for ischemic heart disease 09/11/2014   Essential hypertension 08/27/2014    Past Surgical History:  Procedure Laterality Date   TOOTH EXTRACTION     Full Dentures       Home Medications    Prior to Admission medications   Medication Sig Start Date End Date Taking? Authorizing Provider  benzonatate (TESSALON) 200 MG capsule Take 1 capsule (200 mg total) by mouth 3 (three) times daily as needed for up to 7 days for cough. 05/27/21 06/03/21 Yes Abdulai Blaylock C, PA-C  Cetirizine HCl 10 MG CAPS Take 1 capsule (10 mg total) by mouth daily for 10 days. 05/27/21 06/06/21 Yes Zalia Hautala C, PA-C  docusate sodium (COLACE) 100 MG capsule Take 100 mg by mouth daily as needed for mild constipation.    [provider]  hydrochlorothiazide (HYDRODIURIL) 12.5 MG tablet TAKE 1 TABLET(12.5 MG) BY MOUTH DAILY  10/21/20   Sharlene Dory, DO  losartan (COZAAR) 100 MG tablet TAKE 1 TABLET(100 MG) BY MOUTH DAILY 05/08/21   Sharlene Dory, DO  Multiple Vitamin (MULTIVITAMIN) tablet Take 1 tablet by mouth daily.    [provider]  Omega-3 Fatty Acids (FISH OIL) 1200 MG CAPS Take 1,200 mg by mouth daily.    [provider]  rosuvastatin (CRESTOR) 20 MG tablet Take 1 tablet (20 mg total) by mouth daily. 12/18/20   Sharlene Dory, DO    Family History Family History  Problem Relation Age of Onset   Healthy Mother    Healthy Father    Healthy Brother        x5   Healthy Sister        x1   Healthy Son        x2   Healthy Daughter        x1   Colon cancer Neg Hx    Esophageal cancer Neg Hx    Rectal cancer Neg Hx    Stomach cancer Neg Hx     Social History Social History   Tobacco Use   Smoking status: Former   Smokeless tobacco: Never   Tobacco comments:    Quit >10 yrs ago  Vaping Use   Vaping Use: Never used  Substance Use Topics   Alcohol use: Not Currently   Drug use: No  Allergies   Patient has no known allergies.   Review of Systems Review of Systems  Constitutional:  Negative for activity change, appetite change, chills, fatigue and fever.  HENT:  Positive for sore throat. Negative for congestion, ear pain, rhinorrhea, sinus pressure and trouble swallowing.   Eyes:  Negative for discharge and redness.  Respiratory:  Positive for cough. Negative for chest tightness and shortness of breath.   Cardiovascular:  Negative for chest pain.  Gastrointestinal:  Negative for abdominal pain, diarrhea, nausea and vomiting.  Musculoskeletal:  Negative for myalgias.  Skin:  Negative for rash.  Neurological:  Negative for dizziness, light-headedness and headaches.    Physical Exam Triage Vital Signs ED Triage Vitals  Enc Vitals Group     BP      Pulse      Resp      Temp      Temp src      SpO2      Weight      Height       Head Circumference      Peak Flow      Pain Score      Pain Loc      Pain Edu?      Excl. in GC?    No data found.  Updated Vital Signs BP (!) 160/85   Pulse 77   Temp 97.8 F (36.6 C)   Resp 19   SpO2 93%   Visual Acuity Right Eye Distance:   Left Eye Distance:   Bilateral Distance:    Right Eye Near:   Left Eye Near:    Bilateral Near:     Physical Exam Vitals and nursing note reviewed.  Constitutional:      Appearance: He is well-developed.     Comments: No acute distress  HENT:     Head: Normocephalic and atraumatic.     Ears:     Comments: Bilateral ears without tenderness to palpation of external auricle, tragus and mastoid, EAC's without erythema or swelling, TM's with good bony landmarks and cone of light. Non erythematous.      Nose: Nose normal.     Mouth/Throat:     Comments: Oral mucosa pink and moist, no tonsillar enlargement or exudate. Posterior pharynx patent and nonerythematous, no uvula deviation or swelling. Normal phonation.  Eyes:     Conjunctiva/sclera: Conjunctivae normal.  Cardiovascular:     Rate and Rhythm: Normal rate.  Pulmonary:     Effort: Pulmonary effort is normal. No respiratory distress.     Comments: Breathing comfortably at rest, CTABL, no wheezing, rales or other adventitious sounds auscultated  Abdominal:     General: There is no distension.  Musculoskeletal:        General: Normal range of motion.     Cervical back: Neck supple.  Skin:    General: Skin is warm and dry.  Neurological:     Mental Status: He is alert and oriented to person, place, and time.     UC Treatments / Results  Labs (all labs ordered are listed, but only abnormal results are displayed) Labs Reviewed  NOVEL CORONAVIRUS, NAA    EKG   Radiology No results found.  Procedures Procedures (including critical care time)  Medications Ordered in UC Medications - No data to display  Initial Impression / Assessment and Plan / UC Course  I  have reviewed the triage vital signs and the nursing notes.  Pertinent labs & imaging results that were available  during my care of the patient were reviewed by me and considered in my medical decision making (see chart for details).     Viral URI with cough-COVID test pending, recommending symptomatic and supportive care rest and fluids, exam reassuring, vital signs stable.  Tessalon for cough, cetirizine to help with postnasal drainage/throat irritation, continue to monitor,Discussed strict return precautions. Patient verbalized understanding and is agreeable with plan.  Final Clinical Impressions(s) / UC Diagnoses   Final diagnoses:  Cough     Discharge Instructions      COVID test pending, we will call if positive Tessalon every 8 hours for cough Daily cetrizine to help with sore throat/post nasal drainage Tylenol and ibuprofen as needed Follow up if not improving over the next 4-5 days     ED Prescriptions     Medication Sig Dispense Auth. Provider   benzonatate (TESSALON) 200 MG capsule Take 1 capsule (200 mg total) by mouth 3 (three) times daily as needed for up to 7 days for cough. 28 capsule Claudine Stallings C, PA-C   Cetirizine HCl 10 MG CAPS Take 1 capsule (10 mg total) by mouth daily for 10 days. 10 capsule Jabree Pernice, [place] C, PA-C      PDMP not reviewed this encounter.   Lew Dawes, [place] 05/27/21 1156

## 2021-05-27 NOTE — ED Triage Notes (Addendum)
Pt presents with complaints of cough for 2 days. Reports the cough is dry and makes his throat itch. Translator used for intake. Pt would not like covid testing.

## 2021-05-28 LAB — NOVEL CORONAVIRUS, NAA: SARS-CoV-2, NAA: NOT DETECTED

## 2021-05-28 LAB — SARS-COV-2, NAA 2 DAY TAT

## 2021-06-09 ENCOUNTER — Other Ambulatory Visit: Payer: Self-pay

## 2021-06-09 DIAGNOSIS — I10 Essential (primary) hypertension: Secondary | ICD-10-CM

## 2021-06-09 MED ORDER — HYDROCHLOROTHIAZIDE 12.5 MG PO TABS: 12.5000 mg | ORAL_TABLET | Freq: Every day | ORAL | 0 refills | Status: DC

## 2021-06-09 NOTE — Telephone Encounter (Signed)
Pt son called and stated that dad was out of his rx and it has now been sent to pharmacy. He has an upcoming appt on 06/20/21

## 2021-06-20 ENCOUNTER — Other Ambulatory Visit: Payer: Self-pay

## 2021-06-20 ENCOUNTER — Encounter: Payer: Self-pay | Admitting: Family Medicine

## 2021-06-20 ENCOUNTER — Ambulatory Visit (INDEPENDENT_AMBULATORY_CARE_PROVIDER_SITE_OTHER): Payer: Medicare Other | Admitting: Family Medicine

## 2021-06-20 VITALS — BP 120/76 | HR 51 | Temp 97.6°F | Ht 60.0 in | Wt 135.1 lb

## 2021-06-20 DIAGNOSIS — R059 Cough, unspecified: Secondary | ICD-10-CM | POA: Diagnosis not present

## 2021-06-20 DIAGNOSIS — E782 Mixed hyperlipidemia: Secondary | ICD-10-CM | POA: Diagnosis not present

## 2021-06-20 DIAGNOSIS — I10 Essential (primary) hypertension: Secondary | ICD-10-CM | POA: Diagnosis not present

## 2021-06-20 LAB — COMPREHENSIVE METABOLIC PANEL
ALT: 18 U/L (ref 0–53)
AST: 26 U/L (ref 0–37)
Albumin: 4.5 g/dL (ref 3.5–5.2)
Alkaline Phosphatase: 61 U/L (ref 39–117)
BUN: 14 mg/dL (ref 6–23)
CO2: 29 mEq/L (ref 19–32)
Calcium: 9.4 mg/dL (ref 8.4–10.5)
Chloride: 101 mEq/L (ref 96–112)
Creatinine, Ser: 1 mg/dL (ref 0.40–1.50)
GFR: 71.4 mL/min (ref >60.00)
Glucose, Bld: 88 mg/dL (ref 70–99)
Potassium: 3.8 mEq/L (ref 3.5–5.1)
Sodium: 138 mEq/L (ref 135–145)
Total Bilirubin: 0.8 mg/dL (ref 0.2–1.2)
Total Protein: 7.5 g/dL (ref 6.0–8.3)

## 2021-06-20 LAB — LDL CHOLESTEROL, DIRECT: Direct LDL: 40 mg/dL

## 2021-06-20 LAB — LIPID PANEL
Cholesterol: 104 mg/dL (ref 0–200)
HDL: 34.2 mg/dL — ABNORMAL LOW (ref >39.00)
NonHDL: 69.78
Total CHOL/HDL Ratio: 3
Triglycerides: 201 mg/dL — ABNORMAL HIGH (ref 0.0–149.0)
VLDL: 40.2 mg/dL — ABNORMAL HIGH (ref 0.0–40.0)

## 2021-06-20 MED ORDER — PANTOPRAZOLE SODIUM 40 MG PO TBEC: 40.0000 mg | DELAYED_RELEASE_TABLET | Freq: Every day | ORAL | 1 refills | Status: DC

## 2021-06-20 NOTE — Progress Notes (Signed)
Chief Complaint  Patient presents with   Annual Exam    Subjective Timothy Blankenship is a 80 y.o. male who presents for hypertension follow up. He does monitor home blood pressures. Blood pressures ranging from 140's/80's on average. He is compliant with medications- losartan 100 mg/d, HCTZ 12.5 mg/d. Patient has these side effects of medication: none He is adhering to a healthy diet overall. Current exercise: walking, stretching, aerobic exercising in AM  No CP or SOB.  Mixed Hyperlipidemia Patient presents for mixed hyperlipidemia follow up. Currently being treated with Crestor 20 mg/d and compliance with treatment thus far has been good. He denies myalgias. Diet/exercise as above.  The patient is not known to have coexisting coronary artery disease.  Cough Dealing with a cough for around a month. He will belch and feel itching in his throat causing him to cough. He was given Zyrtec and benzonatate that did not help. No fevers, wheezing, sob. No a/w meals/position. He is a former smoker.   Past Medical History:  Diagnosis Date   High cholesterol    Hypertension    Vascular abnormality    Treated    Exam BP 120/76   Pulse (!) 51   Temp 97.6 F (36.4 C) (Oral)   Ht 5' (1.524 m)   Wt 135 lb 2 oz (61.3 kg)   SpO2 96%   BMI 26.39 kg/m  General:  well developed, well nourished, in no apparent distress HEENT: MMM, no PND or exudates, ears neg b/l, nares patent w/o dc Heart: Reg rhythm, bradycardic, no bruits, no LE edema Lungs: clear to auscultation, no accessory muscle use Psych: well oriented with normal range of affect and appropriate judgment/insight  Essential hypertension  Mixed hyperlipidemia - Plan: Comprehensive metabolic panel, Lipid panel  Cough - Plan: pantoprazole (PROTONIX) 40 MG tablet  Chronic, stable. Cont HCTZ 12.5/d, losartan 100 mg/d. Counseled on diet and exercise. Chronic, stable. Cont Crestor 20 mg/d. New. Tx for GERD w Protonix 40 mg/d.  F/u in 1 mo  to reck cough. The patient voiced understanding and agreement to the plan.  Jilda Roche [place], DO 06/20/21  8:17 AM

## 2021-06-20 NOTE — Patient Instructions (Addendum)
Give [place] 2-3 business days to get the results of your labs back.   Keep the diet clean and stay active.  The new Shingrix vaccine (for shingles) is a 2 shot series. It can make people feel low energy, achy and almost like they have the flu for 48 hours after injection. Please plan accordingly when deciding on when to get this shot. Call your pharmacy for an appointment to get this. The second shot of the series is less severe regarding the side effects, but it still lasts 48 hours.   I recommend getting the flu shot in mid October. This suggestion would change if the CDC comes out with a different recommendation.   Let us know if you need anything.

## 2021-07-22 ENCOUNTER — Ambulatory Visit (INDEPENDENT_AMBULATORY_CARE_PROVIDER_SITE_OTHER): Payer: Medicare Other | Admitting: Family Medicine

## 2021-07-22 ENCOUNTER — Other Ambulatory Visit: Payer: Self-pay | Admitting: Family Medicine

## 2021-07-22 ENCOUNTER — Other Ambulatory Visit: Payer: Self-pay

## 2021-07-22 ENCOUNTER — Encounter: Payer: Self-pay | Admitting: Family Medicine

## 2021-07-22 VITALS — BP 140/68 | HR 63 | Temp 98.0°F | Ht 60.0 in | Wt 135.4 lb

## 2021-07-22 DIAGNOSIS — K219 Gastro-esophageal reflux disease without esophagitis: Secondary | ICD-10-CM

## 2021-07-22 DIAGNOSIS — R059 Cough, unspecified: Secondary | ICD-10-CM | POA: Diagnosis not present

## 2021-07-22 DIAGNOSIS — E782 Mixed hyperlipidemia: Secondary | ICD-10-CM

## 2021-07-22 LAB — LIPID PANEL
Cholesterol: 121 mg/dL (ref 0–200)
HDL: 45.1 mg/dL (ref >39.00)
LDL Cholesterol: 42 mg/dL (ref 0–99)
NonHDL: 75.47
Total CHOL/HDL Ratio: 3
Triglycerides: 166 mg/dL — ABNORMAL HIGH (ref 0.0–149.0)
VLDL: 33.2 mg/dL (ref 0.0–40.0)

## 2021-07-22 MED ORDER — ROSUVASTATIN CALCIUM 40 MG PO TABS: 40.0000 mg | ORAL_TABLET | Freq: Every day | ORAL | 3 refills | Status: DC

## 2021-07-22 NOTE — Patient Instructions (Addendum)
The only lifestyle changes that have data behind them are weight loss for the overweight/obese and elevating the head of the bed. Finding out which foods/positions are triggers is important.  OK to use the Protonix as needed at this point. An alternative would be Pepcid (famotidine) 20 mg twice daily as needed.   Let us know if you need anything.

## 2021-07-22 NOTE — Progress Notes (Signed)
Chief Complaint  Patient presents with   Follow-up   Cough    Timothy Blankenship is 80 y.o. and is here for a cough. Here w aid of [name] interpreter.   Reports cough is nearly gone after starting Protonix mg/d. He reports compliance, no AE's. No N/V/D, bleeding, unintentional weight loss.    Hyperlipidemia Patient presents for dyslipidemia follow up. Currently being treated with Crestor 20 mg/d and compliance with treatment thus far has been good. He denies myalgias. He is adhering to a healthy diet. Exercise: walking The patient is not known to have coexisting coronary artery disease.  Past Medical History:  Diagnosis Date   High cholesterol    Hypertension    Vascular abnormality    Treated   Family History  Problem Relation Age of Onset   Healthy Mother    Healthy Father    Healthy Brother        x5   Healthy Sister        x1   Healthy Son        x2   Healthy Daughter        x1   Colon cancer Neg Hx    Esophageal cancer Neg Hx    Rectal cancer Neg Hx    Stomach cancer Neg Hx     BP 140/68   Pulse 63   Temp 98 F (36.7 C) (Oral)   Ht 5' (1.524 m)   Wt 135 lb 6 oz (61.4 kg)   SpO2 96%   BMI 26.44 kg/m  Gen: Awake, alert, appears stated age HEENT: Pharynx pink without exudate Abd: BS+, S, NT, ND Heart: RRR, no LE edema Lungs: CTAB, normal effort, no accessory muscle use Psych: Age appropriate judgement and insight, normal mood and affect  Gastroesophageal reflux disease, unspecified whether esophagitis present  Cough  Mixed hyperlipidemia - Plan: Lipid panel  1/2. Chronic, Improved. Ok to stop Protonix. Pepcid prn.  3. Chronic, unstable. Cont Crestor 20 mg/d. Reck lipids today. F/u in 5 mo for med check or prn.  The patient thru the interpreter voiced understanding and agreement to the plan.  Jilda Roche [place], DO 07/22/21 8:16 AM

## 2021-07-22 NOTE — Progress Notes (Signed)
lipid

## 2021-08-20 ENCOUNTER — Other Ambulatory Visit: Payer: Self-pay | Admitting: Family Medicine

## 2021-08-20 DIAGNOSIS — I10 Essential (primary) hypertension: Secondary | ICD-10-CM

## 2021-09-02 ENCOUNTER — Other Ambulatory Visit: Payer: Medicare Other

## 2021-09-04 ENCOUNTER — Other Ambulatory Visit (INDEPENDENT_AMBULATORY_CARE_PROVIDER_SITE_OTHER): Payer: Medicare Other

## 2021-09-04 ENCOUNTER — Other Ambulatory Visit: Payer: Self-pay

## 2021-09-04 DIAGNOSIS — E782 Mixed hyperlipidemia: Secondary | ICD-10-CM | POA: Diagnosis not present

## 2021-09-04 LAB — HEPATIC FUNCTION PANEL
ALT: 17 U/L (ref 0–53)
AST: 27 U/L (ref 0–37)
Albumin: 4.7 g/dL (ref 3.5–5.2)
Alkaline Phosphatase: 50 U/L (ref 39–117)
Bilirubin, Direct: 0.1 mg/dL (ref 0.0–0.3)
Total Bilirubin: 0.7 mg/dL (ref 0.2–1.2)
Total Protein: 7.5 g/dL (ref 6.0–8.3)

## 2021-09-04 LAB — LIPID PANEL
Cholesterol: 117 mg/dL (ref 0–200)
HDL: 44.7 mg/dL (ref >39.00)
LDL Cholesterol: 37 mg/dL (ref 0–99)
NonHDL: 72.24
Total CHOL/HDL Ratio: 3
Triglycerides: 174 mg/dL — ABNORMAL HIGH (ref 0.0–149.0)
VLDL: 34.8 mg/dL (ref 0.0–40.0)

## 2021-09-05 ENCOUNTER — Other Ambulatory Visit: Payer: Self-pay | Admitting: Family Medicine

## 2021-09-05 DIAGNOSIS — E782 Mixed hyperlipidemia: Secondary | ICD-10-CM

## 2021-10-17 ENCOUNTER — Other Ambulatory Visit (INDEPENDENT_AMBULATORY_CARE_PROVIDER_SITE_OTHER): Payer: Medicare Other

## 2021-10-17 DIAGNOSIS — E782 Mixed hyperlipidemia: Secondary | ICD-10-CM

## 2021-10-17 LAB — LIPID PANEL
Cholesterol: 102 mg/dL (ref 0–200)
HDL: 40.5 mg/dL (ref >39.00)
LDL Cholesterol: 32 mg/dL (ref 0–99)
NonHDL: 61.77
Total CHOL/HDL Ratio: 3
Triglycerides: 147 mg/dL (ref 0.0–149.0)
VLDL: 29.4 mg/dL (ref 0.0–40.0)

## 2021-11-18 ENCOUNTER — Other Ambulatory Visit: Payer: Self-pay | Admitting: Family Medicine

## 2021-11-18 DIAGNOSIS — I10 Essential (primary) hypertension: Secondary | ICD-10-CM

## 2021-11-22 ENCOUNTER — Other Ambulatory Visit: Payer: Self-pay | Admitting: Family Medicine

## 2021-11-22 DIAGNOSIS — I10 Essential (primary) hypertension: Secondary | ICD-10-CM

## 2021-11-25 ENCOUNTER — Other Ambulatory Visit: Payer: Self-pay | Admitting: Family Medicine

## 2021-11-25 DIAGNOSIS — I10 Essential (primary) hypertension: Secondary | ICD-10-CM

## 2021-12-23 ENCOUNTER — Ambulatory Visit (INDEPENDENT_AMBULATORY_CARE_PROVIDER_SITE_OTHER): Payer: Medicare Other | Admitting: Family Medicine

## 2021-12-23 ENCOUNTER — Encounter: Payer: Self-pay | Admitting: Family Medicine

## 2021-12-23 VITALS — BP 120/71 | HR 54 | Temp 97.9°F | Ht 60.0 in | Wt 139.0 lb

## 2021-12-23 DIAGNOSIS — E782 Mixed hyperlipidemia: Secondary | ICD-10-CM

## 2021-12-23 DIAGNOSIS — I10 Essential (primary) hypertension: Secondary | ICD-10-CM

## 2021-12-23 NOTE — Progress Notes (Signed)
Chief Complaint  Patient presents with   Follow-up    Subjective Timothy Blankenship is a 81 y.o. male who presents for hypertension follow up. Here w aid of [name] interpreter.  He does monitor home blood pressures. Blood pressures ranging from 140's/80's on average. He is compliant with medications- HCTZ 12.5 mg/d, losartan 100 mg/d. Patient has these side effects of medication: none He is adhering to a healthy diet overall. Current exercise: aerobic exercises No CP or SOB.  Mixed Hyperlipidemia Patient presents for mixed hyperlipidemia follow up. Currently being treated with Crestor 40 gmd/ and compliance with treatment thus far has been good. He denies myalgias. Diet/exercise as above.  The patient is not known to have coexisting coronary artery disease.   Past Medical History:  Diagnosis Date   High cholesterol    Hypertension    Vascular abnormality    Treated    Exam BP 120/71    Pulse (!) 54    Temp 97.9 F (36.6 C) (Oral)    Ht 5' (1.524 m)    Wt 139 lb (63 kg)    SpO2 97%    BMI 27.15 kg/m  General:  well developed, well nourished, in no apparent distress Heart: Reg rhythm, bradycardic, no LE edema Lungs: clear to auscultation, no accessory muscle use Psych: well oriented with normal range of affect and appropriate judgment/insight  Essential hypertension  Mixed hyperlipidemia - Plan: Lipid panel, Comprehensive metabolic panel  Chronic, stable. Cont losartan 100 mg/d, HCTZ 12.5 mg/d. Counseled on diet and exercise. Chronic, stable. Cont Crestor 40 mg/d, ck labs.  Advanced directive form provided today.  F/u in 6 mo for CPE or prn. The patient, through the interpreter, voiced understanding and agreement to the plan.  Jilda Roche [place], DO 12/23/21  8:47 AM

## 2021-12-23 NOTE — Patient Instructions (Signed)
Give us 2-3 business days to get the results of your labs back.   Keep the diet clean and stay active.  Let us know if you need anything. 

## 2021-12-24 LAB — LIPID PANEL
Cholesterol: 111 mg/dL (ref 0–200)
HDL: 46.8 mg/dL (ref >39.00)
LDL Cholesterol: 32 mg/dL (ref 0–99)
NonHDL: 64.6
Total CHOL/HDL Ratio: 2
Triglycerides: 162 mg/dL — ABNORMAL HIGH (ref 0.0–149.0)
VLDL: 32.4 mg/dL (ref 0.0–40.0)

## 2021-12-24 LAB — COMPREHENSIVE METABOLIC PANEL
ALT: 28 U/L (ref 0–53)
AST: 32 U/L (ref 0–37)
Albumin: 4.8 g/dL (ref 3.5–5.2)
Alkaline Phosphatase: 52 U/L (ref 39–117)
BUN: 20 mg/dL (ref 6–23)
CO2: 32 mEq/L (ref 19–32)
Calcium: 9.9 mg/dL (ref 8.4–10.5)
Chloride: 103 mEq/L (ref 96–112)
Creatinine, Ser: 1.08 mg/dL (ref 0.40–1.50)
GFR: 64.86 mL/min (ref >60.00)
Glucose, Bld: 89 mg/dL (ref 70–99)
Potassium: 3.6 mEq/L (ref 3.5–5.1)
Sodium: 140 mEq/L (ref 135–145)
Total Bilirubin: 0.8 mg/dL (ref 0.2–1.2)
Total Protein: 7.7 g/dL (ref 6.0–8.3)

## 2022-02-18 ENCOUNTER — Other Ambulatory Visit: Payer: Self-pay | Admitting: Family Medicine

## 2022-02-18 DIAGNOSIS — I10 Essential (primary) hypertension: Secondary | ICD-10-CM

## 2022-05-27 ENCOUNTER — Encounter: Payer: Self-pay | Admitting: Family Medicine

## 2022-05-27 ENCOUNTER — Ambulatory Visit (INDEPENDENT_AMBULATORY_CARE_PROVIDER_SITE_OTHER): Payer: Medicare Other | Admitting: Family Medicine

## 2022-05-27 VITALS — BP 110/68 | HR 55 | Temp 97.9°F | Ht 60.0 in | Wt 135.0 lb

## 2022-05-27 DIAGNOSIS — H538 Other visual disturbances: Secondary | ICD-10-CM | POA: Diagnosis not present

## 2022-05-27 DIAGNOSIS — M67959 Unspecified disorder of synovium and tendon, unspecified thigh: Secondary | ICD-10-CM

## 2022-05-27 NOTE — Progress Notes (Signed)
Musculoskeletal Exam  Patient: Timothy Blankenship DOB: [birth year]  DOS: 05/27/2022  SUBJECTIVE:  Chief Complaint:   Chief Complaint  Patient presents with   Leg Pain    Left     Timothy Blankenship is a 81 y.o.  male for evaluation and treatment of L thigh pain. Here w aid of video interpreter ([name]   Onset:  3 days ago. No inj or change in activity.  Location: L outer thigh and L buttock Character:  sharp and tingling  Progression of issue:  is unchanged Associated symptoms: tingling, sometimes will have back pain No bruising, redness, swelling Treatment: to date has been: none.   Neurovascular symptoms: no weakness  Past Medical History:  Diagnosis Date   High cholesterol    Hypertension    Vascular abnormality    Treated    Objective: VITAL SIGNS: BP 110/68   Pulse (!) 55   Temp 97.9 F (36.6 C) (Oral)   Ht 5' (1.524 m)   Wt 135 lb (61.2 kg)   SpO2 99%   BMI 26.37 kg/m  Constitutional: Well formed, well developed. No acute distress. Thorax & Lungs: No accessory muscle use Musculoskeletal: L thigh.   Tenderness to palpation: yes over L glute med Deformity: no Ecchymosis: no Pain w resisted L hip abduction Tests positive: none Tests negative: Stinchfield, log roll, FABER, FADIR Neurologic: Normal sensory function. No focal deficits noted. Gait nml.  Psychiatric: Normal mood. Age appropriate judgment and insight. Alert & oriented x 3.    Assessment:  Tendinopathy of gluteus medius  Blurry vision - Plan: Ambulatory referral to Optometry  Plan: Stretches/exercises glute med, heat, ice, Tylenol. Refer optometry at his request to get new glasses.   F/u in 1 mo if no improvement. The patient, thru the interpreter, voiced understanding and agreement to the plan.   Jilda Roche [place], DO 05/27/22  8:50 AM

## 2022-05-27 NOTE — Patient Instructions (Addendum)
Heat (pad or rice pillow in microwave) over affected area, 10-15 minutes twice daily.   Ice/cold pack over area for 10-15 min twice daily.  OK to take Tylenol 1000 mg (2 extra strength tabs) or 975 mg (3 regular strength tabs) every 6 hours as needed.  Let us know if you need anything.  Gluteus Medius Syndrome Rehab It is normal to feel mild stretching, pulling, tightness, or discomfort as you do these exercises, but you should stop right away if you feel sudden pain or your pain gets worse.   Stretching and range of motion exercise This exercise warms up your muscles and joints and improves the movement and flexibility of your hip and pelvis. This exercise also helps to relieve pain and stiffness. Exercise A: Lunge (hip flexor stretch)      Kneel on the floor on your left / right knee. Bend your other knee so it is directly over your ankle. Keep good posture with your head over your shoulders. Tuck your tailbone underneath you. This will prevent your back from arching too much. You should feel a gentle stretch in the front of your thigh or hip. If you do not feel a stretch, slowly lunge forward with your chest up. Hold this position for 30 seconds. Slowly return to the starting position. Repeat 2 times. Complete this exercise 3 times per week. Strengthening exercises These exercises build strength and endurance in your hip and pelvis. Endurance is the ability to use your muscles for a long time, even after they get tired. Exercise B: Bridge (hip extensors)     Lie on your back on a firm surface with your knees bent and your feet flat on the floor. Tighten your buttocks muscles and lift your bottom off the floor until the trunk of your body is level with your thighs. You should feel the muscles working in your buttocks and the back of your thighs. If this exercise is too easy, cross your arms over your chest or lift one leg while your bottom is up off the floor. Do not arch your  back. Hold this position for 3 seconds. Slowly lower your hips to the starting position. Let your muscles relax completely between repetitions. Repeat 2 times. Complete this exercise 3 times per week. Exercise C: Straight leg raises (hip abductors)     Lie on your side with your left / right leg in the top position. Lie so your head, shoulder, knee, and hip line up. Bend your bottom knee to help you balance. Lift your top leg up 4-6 inches (10-15 cm), keeping your toes pointed straight ahead. Hold this position for 2 seconds. Slowly lower your leg to the starting position and let your muscles relax completely. Repeat for a total of 10 repetitions. Repeat 2 times. Complete this exercise 3 times per week. Exercise D: Hip abductors and external rotators, quadruped Get on your hands and knees on a firm, lightly padded surface. Your hands should be directly below your shoulders, and your knees should be directly below your hips. Lift your left / right knee out to the side. Keep your knee bent. Do not twist your body. Hold this position for 3 seconds. Slowly lower your leg. Repeat for a total of 10 repetitions.  Repeat 2 times. Complete this exercise 3 times per week. Exercise E: Single leg stand Stand near a counter or door frame to hold onto as needed. It is helpful to look in a mirror for this exercise so you can   watch your hip. Squeeze your left / right buttock muscles then lift up your other foot. Do not let your left / right hip push out to the side. Hold this position for 3 seconds. Repeat for a total of 10 repetitions. Repeat 2 times. Complete this exercise 3 times per week. Make sure you discuss any questions you have with your health care provider. Document Released: 10/26/2005 Document Revised: 07/02/2016 Document Reviewed: 10/08/2015 Elsevier Interactive Patient Education  2018 [employer]  

## 2022-06-15 DIAGNOSIS — Z135 Encounter for screening for eye and ear disorders: Secondary | ICD-10-CM | POA: Diagnosis not present

## 2022-06-15 DIAGNOSIS — H2513 Age-related nuclear cataract, bilateral: Secondary | ICD-10-CM | POA: Diagnosis not present

## 2022-06-22 DIAGNOSIS — H2513 Age-related nuclear cataract, bilateral: Secondary | ICD-10-CM | POA: Diagnosis not present

## 2022-06-22 DIAGNOSIS — H25043 Posterior subcapsular polar age-related cataract, bilateral: Secondary | ICD-10-CM | POA: Diagnosis not present

## 2022-06-22 DIAGNOSIS — H25013 Cortical age-related cataract, bilateral: Secondary | ICD-10-CM | POA: Diagnosis not present

## 2022-06-22 DIAGNOSIS — H2512 Age-related nuclear cataract, left eye: Secondary | ICD-10-CM | POA: Diagnosis not present

## 2022-06-22 DIAGNOSIS — I10 Essential (primary) hypertension: Secondary | ICD-10-CM | POA: Diagnosis not present

## 2022-06-23 ENCOUNTER — Encounter: Payer: Self-pay | Admitting: Family Medicine

## 2022-06-23 ENCOUNTER — Ambulatory Visit (INDEPENDENT_AMBULATORY_CARE_PROVIDER_SITE_OTHER): Payer: Medicare Other | Admitting: Family Medicine

## 2022-06-23 ENCOUNTER — Other Ambulatory Visit: Payer: Self-pay | Admitting: Family Medicine

## 2022-06-23 VITALS — BP 136/80 | HR 58 | Temp 98.0°F | Ht 60.0 in | Wt 137.0 lb

## 2022-06-23 DIAGNOSIS — Z0000 Encounter for general adult medical examination without abnormal findings: Secondary | ICD-10-CM | POA: Diagnosis not present

## 2022-06-23 DIAGNOSIS — E782 Mixed hyperlipidemia: Secondary | ICD-10-CM

## 2022-06-23 DIAGNOSIS — I10 Essential (primary) hypertension: Secondary | ICD-10-CM

## 2022-06-23 DIAGNOSIS — R748 Abnormal levels of other serum enzymes: Secondary | ICD-10-CM

## 2022-06-23 LAB — COMPREHENSIVE METABOLIC PANEL
ALT: 54 U/L — ABNORMAL HIGH (ref 0–53)
AST: 53 U/L — ABNORMAL HIGH (ref 0–37)
Albumin: 4.4 g/dL (ref 3.5–5.2)
Alkaline Phosphatase: 47 U/L (ref 39–117)
BUN: 16 mg/dL (ref 6–23)
CO2: 28 mEq/L (ref 19–32)
Calcium: 9.3 mg/dL (ref 8.4–10.5)
Chloride: 103 mEq/L (ref 96–112)
Creatinine, Ser: 0.92 mg/dL (ref 0.40–1.50)
GFR: 78.35 mL/min (ref >60.00)
Glucose, Bld: 88 mg/dL (ref 70–99)
Potassium: 3.9 mEq/L (ref 3.5–5.1)
Sodium: 140 mEq/L (ref 135–145)
Total Bilirubin: 0.7 mg/dL (ref 0.2–1.2)
Total Protein: 7 g/dL (ref 6.0–8.3)

## 2022-06-23 LAB — LIPID PANEL
Cholesterol: 108 mg/dL (ref 0–200)
HDL: 41.8 mg/dL (ref >39.00)
LDL Cholesterol: 31 mg/dL (ref 0–99)
NonHDL: 66.15
Total CHOL/HDL Ratio: 3
Triglycerides: 175 mg/dL — ABNORMAL HIGH (ref 0.0–149.0)
VLDL: 35 mg/dL (ref 0.0–40.0)

## 2022-06-23 LAB — CBC
HCT: 40.3 % (ref 39.0–52.0)
Hemoglobin: 13.4 g/dL (ref 13.0–17.0)
MCHC: 33.1 g/dL (ref 30.0–36.0)
MCV: 87 fl (ref 78.0–100.0)
Platelets: 183 10*3/uL (ref 150.0–400.0)
RBC: 4.64 Mil/uL (ref 4.22–5.81)
RDW: 14.3 % (ref 11.5–15.5)
WBC: 5.1 10*3/uL (ref 4.0–10.5)

## 2022-06-23 MED ORDER — LOSARTAN POTASSIUM 100 MG PO TABS: ORAL_TABLET | ORAL | 2 refills | Status: DC

## 2022-06-23 MED ORDER — HYDROCHLOROTHIAZIDE 12.5 MG PO TABS: ORAL_TABLET | ORAL | 2 refills | Status: DC

## 2022-06-23 NOTE — Progress Notes (Signed)
Chief Complaint  Patient presents with   Annual Exam    Well Male Timothy Blankenship is here for a complete physical. Here w aid of [name] interpreter.  His last physical was >1 year ago.  Current diet: in general, a "healthy" diet.   Current exercise: does various stretches, strengthening exercises, sometimes walks Weight trend: stable Fatigue out of ordinary? No. Seat belt? Yes.   Advanced directive? No  Health maintenance Shingrix- No Tetanus- Yes Pneumonia vaccine- Yes  Past Medical History:  Diagnosis Date   High cholesterol    Hypertension    Vascular abnormality    Treated     Past Surgical History:  Procedure Laterality Date   TOOTH EXTRACTION     Full Dentures    Medications  Current Outpatient Medications on File Prior to Visit  Medication Sig Dispense Refill   docusate sodium (COLACE) 100 MG capsule Take 100 mg by mouth daily as needed for mild constipation.     hydrochlorothiazide (HYDRODIURIL) 12.5 MG tablet TAKE 1 TABLET(12.5 MG) BY MOUTH DAILY 90 tablet 0   losartan (COZAAR) 100 MG tablet TAKE 1 TABLET(100 MG) BY MOUTH DAILY 90 tablet 1   Multiple Vitamin (MULTIVITAMIN) tablet Take 1 tablet by mouth daily.     Omega-3 Fatty Acids (FISH OIL) 1200 MG CAPS Take 1,200 mg by mouth daily.     rosuvastatin (CRESTOR) 40 MG tablet Take 1 tablet (40 mg total) by mouth daily. 90 tablet 3   Allergies No Known Allergies  Family History Family History  Problem Relation Age of Onset   Healthy Mother    Healthy Father    Healthy Brother        x5   Healthy Sister        x1   Healthy Son        x2   Healthy Daughter        x1   Colon cancer Neg Hx    Esophageal cancer Neg Hx    Rectal cancer Neg Hx    Stomach cancer Neg Hx     Review of Systems: Constitutional:  no fevers Eye:  no significant change in vision Ears:  No changes in hearing Nose/Mouth/Throat:  no complaints of nasal congestion, no sore throat Cardiovascular: no chest pain Respiratory:  No  shortness of breath Gastrointestinal:  No change in bowel habits GU:  No frequency Integumentary:  no abnormal skin lesions reported Neurologic:  no headaches Endocrine:  denies unexplained weight changes  Exam BP 136/80   Pulse (!) 58   Temp 98 F (36.7 C) (Oral)   Ht 5' (1.524 m)   Wt 137 lb (62.1 kg)   SpO2 96%   BMI 26.76 kg/m  General:  well developed, well nourished, in no apparent distress Skin:  no significant moles, warts, or growths Head:  no masses, lesions, or tenderness Eyes:  pupils equal and round, sclera anicteric without injection Ears:  canals without lesions, TMs shiny without retraction, no obvious effusion, no erythema Nose:  nares patent, septum midline, mucosa normal Throat/Pharynx:  lips and gingiva without lesion; tongue and uvula midline; non-inflamed pharynx; no exudates or postnasal drainage Lungs:  clear to auscultation, breath sounds equal bilaterally, no respiratory distress Cardio:  regular rhythm, bradycardic, no LE edema or bruits Rectal: Deferred GI: BS+, S, NT, ND, no masses or organomegaly Musculoskeletal:  symmetrical muscle groups noted without atrophy or deformity Neuro:  gait normal; deep tendon reflexes normal and symmetric Psych: well oriented with normal range  of affect and appropriate judgment/insight  Assessment and Plan  Well adult exam  Mixed hyperlipidemia - Plan: Comprehensive metabolic panel, Lipid panel  Essential hypertension - Plan: CBC   Well 81 y.o. male. Counseled on diet and exercise. Advanced directive form provided today.  Shingrix rec'd.  Other orders as above. Follow up in 6 mo.  The patient, thru the interpreter, voiced understanding and agreement to the plan.  Jilda Roche [name], DO 06/23/22 9:08 AM

## 2022-06-23 NOTE — Patient Instructions (Addendum)
Give [place] 2-3 business days to get the results of your labs back.   Keep the diet clean and stay active.  Aim to do some physical exertion for 150 minutes per week. This is typically divided into 5 days per week, 30 minutes per day. The activity should be enough to get your heart rate up. Anything is better than nothing if you have time constraints.  Please get me a copy of your advanced directive form at your convenience.   The Shingrix vaccine (for shingles) is a 2 shot series spaced 2-6 months apart. It can make people feel low energy, achy and almost like they have the flu for 48 hours after injection. 1/5 people can have nausea and/or vomiting. Please plan accordingly when deciding on when to get this shot. Call our office for a nurse visit appointment to get this. The second shot of the series is less severe regarding the side effects, but it still lasts 48 hours.   I recommend getting the flu shot in mid October. This suggestion would change if the CDC comes out with a different recommendation.

## 2022-06-24 ENCOUNTER — Other Ambulatory Visit: Payer: Self-pay | Admitting: Family Medicine

## 2022-06-24 DIAGNOSIS — R748 Abnormal levels of other serum enzymes: Secondary | ICD-10-CM

## 2022-07-03 ENCOUNTER — Ambulatory Visit (HOSPITAL_BASED_OUTPATIENT_CLINIC_OR_DEPARTMENT_OTHER)
Admission: RE | Admit: 2022-07-03 | Discharge: 2022-07-03 | Disposition: A | Discharge status: Home / Self Care | Payer: Medicare Other | Source: Ambulatory Visit | Attending: Family Medicine | Admitting: Family Medicine

## 2022-07-03 ENCOUNTER — Other Ambulatory Visit: Payer: Self-pay | Admitting: Family Medicine

## 2022-07-03 DIAGNOSIS — R945 Abnormal results of liver function studies: Secondary | ICD-10-CM | POA: Diagnosis not present

## 2022-07-03 DIAGNOSIS — R748 Abnormal levels of other serum enzymes: Secondary | ICD-10-CM

## 2022-07-03 DIAGNOSIS — K838 Other specified diseases of biliary tract: Secondary | ICD-10-CM

## 2022-07-06 ENCOUNTER — Telehealth (HOSPITAL_BASED_OUTPATIENT_CLINIC_OR_DEPARTMENT_OTHER): Payer: Self-pay

## 2022-07-18 ENCOUNTER — Ambulatory Visit (HOSPITAL_BASED_OUTPATIENT_CLINIC_OR_DEPARTMENT_OTHER)
Admission: RE | Admit: 2022-07-18 | Discharge: 2022-07-18 | Disposition: A | Discharge status: Home / Self Care | Payer: Medicare Other | Source: Ambulatory Visit | Attending: Family Medicine | Admitting: Family Medicine

## 2022-07-18 DIAGNOSIS — R7989 Other specified abnormal findings of blood chemistry: Secondary | ICD-10-CM | POA: Diagnosis not present

## 2022-07-18 DIAGNOSIS — R748 Abnormal levels of other serum enzymes: Secondary | ICD-10-CM | POA: Insufficient documentation

## 2022-07-18 DIAGNOSIS — K838 Other specified diseases of biliary tract: Secondary | ICD-10-CM | POA: Diagnosis not present

## 2022-07-18 DIAGNOSIS — R932 Abnormal findings on diagnostic imaging of liver and biliary tract: Secondary | ICD-10-CM | POA: Diagnosis not present

## 2022-07-18 DIAGNOSIS — K862 Cyst of pancreas: Secondary | ICD-10-CM | POA: Diagnosis not present

## 2022-07-18 MED ORDER — GADOBUTROL 1 MMOL/ML IV SOLN
6.0000 mL | Freq: Once | INTRAVENOUS | Status: AC | PRN
  Administered 2022-07-18: 6 mL via INTRAVENOUS

## 2022-07-28 ENCOUNTER — Other Ambulatory Visit: Payer: Medicare Other

## 2022-07-29 ENCOUNTER — Encounter: Payer: Self-pay | Admitting: Family Medicine

## 2022-07-29 ENCOUNTER — Ambulatory Visit (INDEPENDENT_AMBULATORY_CARE_PROVIDER_SITE_OTHER): Payer: Medicare Other | Admitting: *Deleted

## 2022-07-29 VITALS — BP 134/65 | HR 58 | Ht 60.0 in | Wt 136.2 lb

## 2022-07-29 DIAGNOSIS — Z0000 Encounter for general adult medical examination without abnormal findings: Secondary | ICD-10-CM

## 2022-07-29 DIAGNOSIS — Z23 Encounter for immunization: Secondary | ICD-10-CM

## 2022-07-29 NOTE — Patient Instructions (Signed)
Timothy Blankenship , Thank you for taking time to come for your Medicare Wellness Visit. I appreciate your ongoing commitment to your health goals. Please review the following plan we discussed and let me know if I can assist you in the future.   These are the goals we discussed:  Goals   None     This is a list of the screening recommended for you and due dates:  Health Maintenance  Topic Date Due   Flu Shot  02/08/2023*   Tetanus Vaccine  06/27/2025   Pneumonia Vaccine  Completed   HPV Vaccine  Aged Out   COVID-19 Vaccine  Discontinued   Zoster (Shingles) Vaccine  Discontinued  *Topic was postponed. The date shown is not the original due date.      Next appointment: Follow up in one year for your annual wellness visit.   Preventive Care 34 Years and Older, Male Preventive care refers to lifestyle choices and visits with your health care provider that can promote health and wellness. What does preventive care include? A yearly physical exam. This is also called an annual well check. Dental exams once or twice a year. Routine eye exams. Ask your health care provider how often you should have your eyes checked. Personal lifestyle choices, including: Daily care of your teeth and gums. Regular physical activity. Eating a healthy diet. Avoiding tobacco and drug use. Limiting alcohol use. Practicing safe sex. Taking low doses of aspirin every day. Taking vitamin and mineral supplements as recommended by your health care provider. What happens during an annual well check? The services and screenings done by your health care provider during your annual well check will depend on your age, overall health, lifestyle risk factors, and family history of disease. Counseling  Your health care provider may ask you questions about your: Alcohol use. Tobacco use. Drug use. Emotional well-being. Home and relationship well-being. Sexual activity. Eating habits. History of falls. Memory and  ability to understand (cognition). Work and work Astronomer. Screening  You may have the following tests or measurements: Height, weight, and BMI. Blood pressure. Lipid and cholesterol levels. These may be checked every 5 years, or more frequently if you are over 42 years old. Skin check. Lung cancer screening. You may have this screening every year starting at age 88 if you have a 30-pack-year history of smoking and currently smoke or have quit within the past 15 years. Fecal occult blood test (FOBT) of the stool. You may have this test every year starting at age 77. Flexible sigmoidoscopy or colonoscopy. You may have a sigmoidoscopy every 5 years or a colonoscopy every 10 years starting at age 11. Prostate cancer screening. Recommendations will vary depending on your family history and other risks. Hepatitis C blood test. Hepatitis B blood test. Sexually transmitted disease (STD) testing. Diabetes screening. This is done by checking your blood sugar (glucose) after you have not eaten for a while (fasting). You may have this done every 1-3 years. Abdominal aortic aneurysm (AAA) screening. You may need this if you are a current or former smoker. Osteoporosis. You may be screened starting at age 84 if you are at high risk. Talk with your health care provider about your test results, treatment options, and if necessary, the need for more tests. Vaccines  Your health care provider may recommend certain vaccines, such as: Influenza vaccine. This is recommended every year. Tetanus, diphtheria, and acellular pertussis (Tdap, Td) vaccine. You may need a Td booster every 10 years. Zoster  vaccine. You may need this after age 84. Pneumococcal 13-valent conjugate (PCV13) vaccine. One dose is recommended after age 7. Pneumococcal polysaccharide (PPSV23) vaccine. One dose is recommended after age 39. Talk to your health care provider about which screenings and vaccines you need and how often you need  them. This information is not intended to replace advice given to you by your health care provider. Make sure you discuss any questions you have with your health care provider. Document Released: 11/22/2015 Document Revised: 07/15/2016 Document Reviewed: 08/27/2015 Elsevier Interactive Patient Education  2017 [place] Prevention in the Home Falls can cause injuries. They can happen to people of all ages. There are many things you can do to make your home safe and to help prevent falls. What can I do on the outside of my home? Regularly fix the edges of walkways and driveways and fix any cracks. Remove anything that might make you trip as you walk through a door, such as a raised step or threshold. Trim any bushes or trees on the path to your home. Use bright outdoor lighting. Clear any walking paths of anything that might make someone trip, such as rocks or tools. Regularly check to see if handrails are loose or broken. Make sure that both sides of any steps have handrails. Any raised decks and porches should have guardrails on the edges. Have any leaves, snow, or ice cleared regularly. Use sand or salt on walking paths during winter. Clean up any spills in your garage right away. This includes oil or grease spills. What can I do in the bathroom? Use night lights. Install grab bars by the toilet and in the tub and shower. Do not use towel bars as grab bars. Use non-skid mats or decals in the tub or shower. If you need to sit down in the shower, use a plastic, non-slip stool. Keep the floor dry. Clean up any water that spills on the floor as soon as it happens. Remove soap buildup in the tub or shower regularly. Attach bath mats securely with double-sided non-slip rug tape. Do not have throw rugs and other things on the floor that can make you trip. What can I do in the bedroom? Use night lights. Make sure that you have a light by your bed that is easy to reach. Do not use  any sheets or blankets that are too big for your bed. They should not hang down onto the floor. Have a firm chair that has side arms. You can use this for support while you get dressed. Do not have throw rugs and other things on the floor that can make you trip. What can I do in the kitchen? Clean up any spills right away. Avoid walking on wet floors. Keep items that you use a lot in easy-to-reach places. If you need to reach something above you, use a strong step stool that has a grab bar. Keep electrical cords out of the way. Do not use floor polish or wax that makes floors slippery. If you must use wax, use non-skid floor wax. Do not have throw rugs and other things on the floor that can make you trip. What can I do with my stairs? Do not leave any items on the stairs. Make sure that there are handrails on both sides of the stairs and use them. Fix handrails that are broken or loose. Make sure that handrails are as long as the stairways. Check any carpeting to make sure that it  is firmly attached to the stairs. Fix any carpet that is loose or worn. Avoid having throw rugs at the top or bottom of the stairs. If you do have throw rugs, attach them to the floor with carpet tape. Make sure that you have a light switch at the top of the stairs and the bottom of the stairs. If you do not have them, ask someone to add them for you. What else can I do to help prevent falls? Wear shoes that: Do not have high heels. Have rubber bottoms. Are comfortable and fit you well. Are closed at the toe. Do not wear sandals. If you use a stepladder: Make sure that it is fully opened. Do not climb a closed stepladder. Make sure that both sides of the stepladder are locked into place. Ask someone to hold it for you, if possible. Clearly mark and make sure that you can see: Any grab bars or handrails. First and last steps. Where the edge of each step is. Use tools that help you move around (mobility aids)  if they are needed. These include: Canes. Walkers. Scooters. Crutches. Turn on the lights when you go into a dark area. Replace any light bulbs as soon as they burn out. Set up your furniture so you have a clear path. Avoid moving your furniture around. If any of your floors are uneven, fix them. If there are any pets around you, be aware of where they are. Review your medicines with your doctor. Some medicines can make you feel dizzy. This can increase your chance of falling. Ask your doctor what other things that you can do to help prevent falls. This information is not intended to replace advice given to you by your health care provider. Make sure you discuss any questions you have with your health care provider. Document Released: 08/22/2009 Document Revised: 04/02/2016 Document Reviewed: 11/30/2014 Elsevier Interactive Patient Education  2017 [employer].

## 2022-07-29 NOTE — Progress Notes (Signed)
Subjective:   Timothy Blankenship is a 81 y.o. male who presents for Medicare Annual/Subsequent preventive examination.  Review of Systems    Defer to PCP Cardiac Risk Factors include: advanced age (>39men, >30 women);male gender;hypertension     Objective:    Today's Vitals   07/29/22 1430  BP: 134/65  Pulse: (!) 58  Weight: 136 lb 3.2 oz (61.8 kg)  Height: 5' (1.524 m)   Body mass index is 26.6 kg/m.     07/29/2022    2:38 PM 03/17/2018    1:51 PM 04/04/2017    6:01 PM  Advanced Directives  Does Patient Have a Medical Advance Directive? No Yes No  Type of Advance Directive  [place]   Does patient want to make changes to medical advance directive?  No - Patient declined   Copy of [place] in Chart?  No - copy requested   Would patient like information on creating a medical advance directive? No - Patient declined      Current Medications (verified) Outpatient Encounter Medications as of 07/29/2022  Medication Sig   hydrochlorothiazide (HYDRODIURIL) 12.5 MG tablet TAKE 1 TABLET(12.5 MG) BY MOUTH DAILY   losartan (COZAAR) 100 MG tablet TAKE 1 TABLET(100 MG) BY MOUTH DAILY   Multiple Vitamin (MULTIVITAMIN) tablet Take 1 tablet by mouth daily.   Omega-3 Fatty Acids (FISH OIL) 1200 MG CAPS Take 1,200 mg by mouth daily.   rosuvastatin (CRESTOR) 40 MG tablet Take 1 tablet (40 mg total) by mouth daily.   [DISCONTINUED] docusate sodium (COLACE) 100 MG capsule Take 100 mg by mouth daily as needed for mild constipation.   No facility-administered encounter medications on file as of 07/29/2022.    Allergies (verified) Patient has no known allergies.   History: Past Medical History:  Diagnosis Date   High cholesterol    Hypertension    Vascular abnormality    Treated   Past Surgical History:  Procedure Laterality Date   TOOTH EXTRACTION     Full Dentures   Family History  Problem Relation Age of Onset   Healthy Mother    Healthy Father     Healthy Brother        x5   Healthy Sister        x1   Healthy Son        x2   Healthy Daughter        x1   Colon cancer Neg Hx    Esophageal cancer Neg Hx    Rectal cancer Neg Hx    Stomach cancer Neg Hx    Social History   Socioeconomic History   Marital status: Married    Spouse name: Not on file   Number of children: Not on file   Years of education: Not on file   Highest education level: Not on file  Occupational History   Not on file  Tobacco Use   Smoking status: Former   Smokeless tobacco: Never   Tobacco comments:    Quit >10 yrs ago  Vaping Use   Vaping Use: Never used  Substance and Sexual Activity   Alcohol use: Not Currently   Drug use: No   Sexual activity: Never  Other Topics Concern   Not on file  Social History Narrative   Not on file   Social Determinants of Health   Financial Resource Strain: Low Risk  (07/29/2022)   Overall Financial Resource Strain (CARDIA)    Difficulty of Paying Living  Expenses: Not hard at all  Food Insecurity: No Food Insecurity (07/29/2022)   Hunger Vital Sign    Worried About Running Out of Food in the Last Year: Never true    Ran Out of Food in the Last Year: Never true  Transportation Needs: No Transportation Needs (07/29/2022)   PRAPARE - Administrator, Civil Service (Medical): No    Lack of Transportation (Non-Medical): No  Physical Activity: Sufficiently Active (07/29/2022)   Exercise Vital Sign    Days of Exercise per Week: 7 days    Minutes of Exercise per Session: 30 min  Stress: No Stress Concern Present (07/29/2022)   [employer] of Occupational Health - Occupational Stress Questionnaire    Feeling of Stress : Not at all  Social Connections: Moderately Isolated (07/29/2022)   Social Connection and Isolation Panel [NHANES]    Frequency of Communication with Friends and Family: Once a week    Frequency of Social Gatherings with Friends and Family: Never    Attends Religious Services: More  than 4 times per year    Active Member of [employer] or Organizations: No    Attends Engineer, structural: Never    Marital Status: Married    Tobacco Counseling Counseling given: Not Answered Tobacco comments: Quit >10 yrs ago   Clinical Intake:  Pre-visit preparation completed: Yes  Pain : No/denies pain     Diabetes: No  How often do you need to have someone help you when you read instructions, pamphlets, or other written materials from your doctor or pharmacy?: 1 - Never  Diabetic? No   Activities of Daily Living    07/29/2022    2:38 PM  In your present state of health, do you have any difficulty performing the following activities:  Hearing? 1  Comment slight hearing loss  Vision? 1  Comment having cataract surgery soon  Difficulty concentrating or making decisions? 0  Walking or climbing stairs? 0  Dressing or bathing? 0  Doing errands, shopping? 0  Preparing Food and eating ? N  Using the Toilet? N  In the past six months, have you accidently leaked urine? N  Do you have problems with loss of bowel control? N  Managing your Medications? N  Managing your Finances? N  Housekeeping or managing your Housekeeping? N    Patient Care Team: Sharlene Dory, DO as PCP - General (Family Medicine)  Indicate any recent Medical Services you may have received from other than [facility] providers in the past year (date may be approximate).     Assessment:   This is a routine wellness examination for Timothy Blankenship.  Hearing/Vision screen No results found.  Dietary issues and exercise activities discussed: Current Exercise Habits: Home exercise routine, Type of exercise: walking, Time (Minutes): 30, Frequency (Times/Week): 7, Weekly Exercise (Minutes/Week): 210, Exercise limited by: None identified   Goals Addressed   None    Depression Screen    07/29/2022    2:37 PM 06/23/2022    8:57 AM 12/23/2021    8:43 AM 12/18/2020    9:23 AM 01/15/2017   10:40 AM  10/25/2015    8:27 AM 08/27/2014    1:54 PM  PHQ 2/9 Scores  PHQ - 2 Score 0 0 0 0 0 0 0  PHQ- 9 Score  0         Fall Risk    07/29/2022    2:37 PM 06/23/2022    8:56 AM 01/15/2021    9:27 AM  01/15/2017   10:40 AM 10/25/2015    8:18 AM  Fall Risk   Falls in the past year? 0 0 0 No No  Number falls in past yr: 0 0 0    Injury with Fall? 0 0 0    Risk for fall due to : No Fall Risks No Fall Risks     Follow up Falls evaluation completed Falls evaluation completed       FALL RISK PREVENTION PERTAINING TO THE HOME:  Any stairs in or around the home? Yes  If so, are there any without handrails? No  Home free of loose throw rugs in walkways, pet beds, electrical cords, etc? Yes  Adequate lighting in your home to reduce risk of falls? Yes   ASSISTIVE DEVICES UTILIZED TO PREVENT FALLS:  Life alert? No  Use of a cane, walker or w/c? No  Grab bars in the bathroom? Yes  Shower chair or bench in shower? No  Elevated toilet seat or a handicapped toilet? No   TIMED UP AND GO:  Was the test performed? Yes .  Length of time to ambulate 10 feet: 7 sec.   Gait steady and fast without use of assistive device  Cognitive Function:        07/29/2022    2:41 PM  6CIT Screen  What Year? 0 points  What month? 0 points  What time? 0 points  Count back from 20 0 points  Months in reverse 0 points  Repeat phrase 4 points  Total Score 4 points    Immunizations Immunization History  Administered Date(s) Administered   Fluad Quad(high Dose 65+) 07/29/2022   Hepatitis A, Adult 12/01/2018   Hepatitis B 08/04/1995   Influenza, High Dose Seasonal PF 09/26/2017, 08/14/2019   Influenza,inj,Quad PF,6+ Mos 07/26/2015   Influenza-Unspecified 11/17/2016, 10/17/2020, 08/22/2021   PFIZER(Purple Top)SARS-COV-2 Vaccination 12/22/2019, 01/12/2020, 08/31/2020   Pneumococcal Conjugate-13 06/28/2015   Pneumococcal Polysaccharide-23 08/01/2006, 01/27/2018   Td 02/10/1995, 04/12/1995, 08/04/1995    Tdap 06/28/2015    TDAP status: Up to date  Flu Vaccine status: Completed at today's visit  Pneumococcal vaccine status: Up to date  Covid-19 vaccine status: Information provided on how to obtain vaccines.   Qualifies for Shingles Vaccine? Yes   Zostavax completed No   Shingrix Completed?: No.    Education has been provided regarding the importance of this vaccine. Patient has been advised to call insurance company to determine out of pocket expense if they have not yet received this vaccine. Advised may also receive vaccine at local pharmacy or Health Dept. Verbalized acceptance and understanding.  Screening Tests Health Maintenance  Topic Date Due   TETANUS/TDAP  06/27/2025   Pneumonia Vaccine 47+ Years old  Completed   INFLUENZA VACCINE  Completed   HPV VACCINES  Aged Out   COVID-19 Vaccine  Discontinued   Zoster Vaccines- Shingrix  Discontinued    Health Maintenance  There are no preventive care reminders to display for this patient.  Colorectal cancer screening: No longer required.   Lung Cancer Screening: (Low Dose CT Chest recommended if Age 68-80 years, 30 pack-year currently smoking OR have quit w/in 15years.) does not qualify.   Lung Cancer Screening Referral: N/a  Additional Screening:  Hepatitis C Screening: does qualify; Completed 12/18/20  Vision Screening: Recommended annual ophthalmology exams for early detection of glaucoma and other disorders of the eye. Is the patient up to date with their annual eye exam?  Yes  Who is the provider or what  is the name of the office in which the patient attends annual eye exams? Doesn't remember name If pt is not established with a provider, would they like to be referred to a provider to establish care? No .   Dental Screening: Recommended annual dental exams for proper oral hygiene  Community Resource Referral / Chronic Care Management: CRR required this visit?  No   CCM required this visit?  No      Plan:      I have personally reviewed and noted the following in the patient's chart:   Medical and social history Use of alcohol, tobacco or illicit drugs  Current medications and supplements including opioid prescriptions. Patient is not currently taking opioid prescriptions. Functional ability and status Nutritional status Physical activity Advanced directives List of other physicians Hospitalizations, surgeries, and ER visits in previous 12 months Vitals Screenings to include cognitive, depression, and falls Referrals and appointments  In addition, I have reviewed and discussed with patient certain preventive protocols, quality metrics, and best practice recommendations. A written personalized care plan for preventive services as well as general preventive health recommendations were provided to patient.     Donne AnonBender, Danalee Flath, [place]   07/29/2022   Nurse Notes: None

## 2022-08-18 ENCOUNTER — Other Ambulatory Visit: Payer: Self-pay | Admitting: Family Medicine

## 2022-08-18 MED ORDER — ROSUVASTATIN CALCIUM 40 MG PO TABS: 40.0000 mg | ORAL_TABLET | Freq: Every day | ORAL | 3 refills | Status: DC

## 2022-08-27 DIAGNOSIS — H2512 Age-related nuclear cataract, left eye: Secondary | ICD-10-CM | POA: Diagnosis not present

## 2022-08-28 DIAGNOSIS — H25011 Cortical age-related cataract, right eye: Secondary | ICD-10-CM | POA: Diagnosis not present

## 2022-08-28 DIAGNOSIS — H2511 Age-related nuclear cataract, right eye: Secondary | ICD-10-CM | POA: Diagnosis not present

## 2022-08-28 DIAGNOSIS — H25041 Posterior subcapsular polar age-related cataract, right eye: Secondary | ICD-10-CM | POA: Diagnosis not present

## 2022-11-27 DIAGNOSIS — H25041 Posterior subcapsular polar age-related cataract, right eye: Secondary | ICD-10-CM | POA: Diagnosis not present

## 2022-11-27 DIAGNOSIS — H25011 Cortical age-related cataract, right eye: Secondary | ICD-10-CM | POA: Diagnosis not present

## 2022-11-27 DIAGNOSIS — H2511 Age-related nuclear cataract, right eye: Secondary | ICD-10-CM | POA: Diagnosis not present

## 2022-12-22 ENCOUNTER — Encounter: Payer: Self-pay | Admitting: Family Medicine

## 2022-12-22 ENCOUNTER — Ambulatory Visit (INDEPENDENT_AMBULATORY_CARE_PROVIDER_SITE_OTHER): Payer: Medicare Other | Admitting: Family Medicine

## 2022-12-22 VITALS — BP 130/72 | HR 66 | Temp 97.6°F | Ht 60.0 in | Wt 140.5 lb

## 2022-12-22 DIAGNOSIS — E782 Mixed hyperlipidemia: Secondary | ICD-10-CM | POA: Diagnosis not present

## 2022-12-22 DIAGNOSIS — I10 Essential (primary) hypertension: Secondary | ICD-10-CM | POA: Diagnosis not present

## 2022-12-22 LAB — COMPREHENSIVE METABOLIC PANEL
ALT: 17 U/L (ref 0–53)
AST: 29 U/L (ref 0–37)
Albumin: 4.4 g/dL (ref 3.5–5.2)
Alkaline Phosphatase: 46 U/L (ref 39–117)
BUN: 13 mg/dL (ref 6–23)
CO2: 29 mEq/L (ref 19–32)
Calcium: 9.3 mg/dL (ref 8.4–10.5)
Chloride: 102 mEq/L (ref 96–112)
Creatinine, Ser: 1.05 mg/dL (ref 0.40–1.50)
GFR: 66.63 mL/min (ref >60.00)
Glucose, Bld: 90 mg/dL (ref 70–99)
Potassium: 3.7 mEq/L (ref 3.5–5.1)
Sodium: 139 mEq/L (ref 135–145)
Total Bilirubin: 0.7 mg/dL (ref 0.2–1.2)
Total Protein: 6.9 g/dL (ref 6.0–8.3)

## 2022-12-22 LAB — LIPID PANEL
Cholesterol: 106 mg/dL (ref 0–200)
HDL: 48.1 mg/dL (ref >39.00)
LDL Cholesterol: 35 mg/dL (ref 0–99)
NonHDL: 57.98
Total CHOL/HDL Ratio: 2
Triglycerides: 115 mg/dL (ref 0.0–149.0)
VLDL: 23 mg/dL (ref 0.0–40.0)

## 2022-12-22 NOTE — Progress Notes (Signed)
Chief Complaint  Patient presents with   Follow-up    Subjective Timothy Blankenship is a 82 y.o. male who presents for hypertension follow up. He is here w the aid of a [place] video interpreter.  He does monitor home blood pressures. Blood pressures ranging from 130's/70-80's on average. He is compliant with medications- HCTZ 12.5 mg/d, losartan 100 mg/d. Patient has these side effects of medication: none He is adhering to a healthy diet overall. Current exercise: walking No Cp or SOB.  Hyperlipidemia Patient presents for dyslipidemia follow up. Currently being treated with Crestor 40 mg/d and compliance with treatment thus far has been good. He denies myalgias. Diet/exercise as above.  The patient is not known to have coexisting coronary artery disease.   Past Medical History:  Diagnosis Date   High cholesterol    Hypertension    Vascular abnormality    Treated    Exam BP 130/72 (BP Location: Left Arm, Patient Position: Sitting, Cuff Size: Normal)   Pulse 66   Temp 97.6 F (36.4 C) (Oral)   Ht 5' (1.524 m)   Wt 140 lb 8 oz (63.7 kg)   SpO2 93%   BMI 27.44 kg/m  General:  well developed, well nourished, in no apparent distress Heart: RRR, no bruits, no LE edema Lungs: clear to auscultation, no accessory muscle use Psych: well oriented with normal range of affect and appropriate judgment/insight  Essential hypertension  Mixed hyperlipidemia - Plan: Comprehensive metabolic panel, Lipid panel  Chronic, stable.  Continue losartan 100 mg daily, hydrochlorothiazide 12.5 mg daily.  Counseled on diet and exercise. Chronic, stable.  Check labs.  Continue Crestor 40 mg daily. F/u in 6 mo or prn. The patient, thru the interpreter, voiced understanding and agreement to the plan.  [place], DO 12/22/22  9:22 AM

## 2022-12-31 DIAGNOSIS — H25041 Posterior subcapsular polar age-related cataract, right eye: Secondary | ICD-10-CM | POA: Diagnosis not present

## 2022-12-31 DIAGNOSIS — H25811 Combined forms of age-related cataract, right eye: Secondary | ICD-10-CM | POA: Diagnosis not present

## 2022-12-31 DIAGNOSIS — H25011 Cortical age-related cataract, right eye: Secondary | ICD-10-CM | POA: Diagnosis not present

## 2022-12-31 DIAGNOSIS — H2511 Age-related nuclear cataract, right eye: Secondary | ICD-10-CM | POA: Diagnosis not present

## 2023-03-01 ENCOUNTER — Other Ambulatory Visit: Payer: Self-pay | Admitting: Family Medicine

## 2023-03-01 DIAGNOSIS — I10 Essential (primary) hypertension: Secondary | ICD-10-CM

## 2023-06-09 ENCOUNTER — Encounter (INDEPENDENT_AMBULATORY_CARE_PROVIDER_SITE_OTHER): Payer: Self-pay

## 2023-07-05 ENCOUNTER — Ambulatory Visit (INDEPENDENT_AMBULATORY_CARE_PROVIDER_SITE_OTHER): Payer: Medicare Other | Admitting: Family Medicine

## 2023-07-05 ENCOUNTER — Encounter: Payer: Self-pay | Admitting: Family Medicine

## 2023-07-05 VITALS — BP 120/80 | HR 60 | Temp 98.0°F | Ht 60.0 in | Wt 141.1 lb

## 2023-07-05 DIAGNOSIS — Z0000 Encounter for general adult medical examination without abnormal findings: Secondary | ICD-10-CM

## 2023-07-05 DIAGNOSIS — M79671 Pain in right foot: Secondary | ICD-10-CM

## 2023-07-05 DIAGNOSIS — M79672 Pain in left foot: Secondary | ICD-10-CM | POA: Diagnosis not present

## 2023-07-05 DIAGNOSIS — N401 Enlarged prostate with lower urinary tract symptoms: Secondary | ICD-10-CM

## 2023-07-05 DIAGNOSIS — I10 Essential (primary) hypertension: Secondary | ICD-10-CM | POA: Diagnosis not present

## 2023-07-05 DIAGNOSIS — R351 Nocturia: Secondary | ICD-10-CM

## 2023-07-05 DIAGNOSIS — R194 Change in bowel habit: Secondary | ICD-10-CM

## 2023-07-05 LAB — CBC
HCT: 43.3 % (ref 39.0–52.0)
Hemoglobin: 14.1 g/dL (ref 13.0–17.0)
MCHC: 32.7 g/dL (ref 30.0–36.0)
MCV: 87.4 fl (ref 78.0–100.0)
Platelets: 207 10*3/uL (ref 150.0–400.0)
RBC: 4.95 Mil/uL (ref 4.22–5.81)
RDW: 13.7 % (ref 11.5–15.5)
WBC: 6.2 10*3/uL (ref 4.0–10.5)

## 2023-07-05 LAB — COMPREHENSIVE METABOLIC PANEL
ALT: 20 U/L (ref 0–53)
AST: 29 U/L (ref 0–37)
Albumin: 4.4 g/dL (ref 3.5–5.2)
Alkaline Phosphatase: 53 U/L (ref 39–117)
BUN: 21 mg/dL (ref 6–23)
CO2: 27 mEq/L (ref 19–32)
Calcium: 9.6 mg/dL (ref 8.4–10.5)
Chloride: 104 mEq/L (ref 96–112)
Creatinine, Ser: 1 mg/dL (ref 0.40–1.50)
GFR: 70.38 mL/min (ref >60.00)
Glucose, Bld: 94 mg/dL (ref 70–99)
Potassium: 3.6 mEq/L (ref 3.5–5.1)
Sodium: 140 mEq/L (ref 135–145)
Total Bilirubin: 0.7 mg/dL (ref 0.2–1.2)
Total Protein: 7.2 g/dL (ref 6.0–8.3)

## 2023-07-05 LAB — LIPID PANEL
Cholesterol: 113 mg/dL (ref 0–200)
HDL: 40.3 mg/dL (ref >39.00)
LDL Cholesterol: 45 mg/dL (ref 0–99)
NonHDL: 72.31
Total CHOL/HDL Ratio: 3
Triglycerides: 135 mg/dL (ref 0.0–149.0)
VLDL: 27 mg/dL (ref 0.0–40.0)

## 2023-07-05 LAB — URIC ACID: Uric Acid, Serum: 6.2 mg/dL (ref 4.0–7.8)

## 2023-07-05 NOTE — Patient Instructions (Addendum)
Give [place] 2-3 business days to get the results of your labs back.   Keep the diet clean and stay active.  I recommend getting the flu shot in mid October. This suggestion would change if the CDC comes out with a different recommendation.   Please get me a copy of your advanced directive form at your convenience.   Take (powder version) Metamucil or Benefiber daily. This should help with the bowels. If it does not, let me know please.   Consider using metatarsal pads when you walk during the day. Send me a message in 3-4 weeks if no better.   Let us know if you need anything.

## 2023-07-05 NOTE — Progress Notes (Signed)
Chief Complaint  Patient presents with   Annual Exam    Foot pain at night Urinating at night more frequently Many BM's per day and sometimes going in pants    Well Male Timothy Blankenship is here for a complete physical.  Here w aid of video [name] interpreter.  His last physical was >1 year ago.  Current diet: in general, a "good" diet.   Current exercise: walking Weight trend: stable Fatigue out of ordinary? No. Seat belt? Yes.   Advanced directive? No  Health maintenance Shingrix- No Tetanus- Yes Pneumonia vaccine- Yes  B/l foot pain Pain over tops of toes over the past 6 mo. No inj or change in activity. No neuro s/s's, bruising, swelling, redness. Not improving. Has not tried anything at home thus far.   Past Medical History:  Diagnosis Date   High cholesterol    Hypertension    Vascular abnormality    Treated     Past Surgical History:  Procedure Laterality Date   TOOTH EXTRACTION     Full Dentures    Medications  Current Outpatient Medications on File Prior to Visit  Medication Sig Dispense Refill   hydrochlorothiazide (HYDRODIURIL) 12.5 MG tablet TAKE 1 TABLET(12.5 MG) BY MOUTH DAILY 90 tablet 2   losartan (COZAAR) 100 MG tablet TAKE 1 TABLET(100 MG) BY MOUTH DAILY 90 tablet 2   Multiple Vitamin (MULTIVITAMIN) tablet Take 1 tablet by mouth daily.     Omega-3 Fatty Acids (FISH OIL) 1200 MG CAPS Take 1,200 mg by mouth daily.     rosuvastatin (CRESTOR) 40 MG tablet Take 1 tablet (40 mg total) by mouth daily. 90 tablet 3   Allergies No Known Allergies  Family History Family History  Problem Relation Age of Onset   Healthy Mother    Healthy Father    Healthy Brother        x5   Healthy Sister        x1   Healthy Son        x2   Healthy Daughter        x1   Colon cancer Neg Hx    Esophageal cancer Neg Hx    Rectal cancer Neg Hx    Stomach cancer Neg Hx     Review of Systems: Constitutional:  no fevers Eye:  no recent significant change in  vision Ears:  No changes in hearing Nose/Mouth/Throat:  no complaints of nasal congestion, no sore throat Cardiovascular: no chest pain Respiratory:  No shortness of breath Gastrointestinal:  +frequency GU:  +nighttime frequency Integumentary:  no abnormal skin lesions reported MSK: +b/l toe pain Neurologic:  no headaches Endocrine:  denies unexplained weight changes  Exam BP 120/80 (BP Location: Left Arm, Patient Position: Sitting, Cuff Size: Normal)   Pulse 60   Temp 98 F (36.7 C) (Oral)   Ht 5' (1.524 m)   Wt 141 lb 2 oz (64 kg)   SpO2 98%   BMI 27.56 kg/m  General:  well developed, well nourished, in no apparent distress Skin:  no significant moles, warts, or growths Head:  no masses, lesions, or tenderness Eyes:  pupils equal and round, sclera anicteric without injection Ears:  canals without lesions, TMs shiny without retraction, no obvious effusion, no erythema Nose:  nares patent, mucosa normal Throat/Pharynx:  lips and gingiva without lesion; tongue and uvula midline; non-inflamed pharynx; no exudates or postnasal drainage Lungs:  clear to auscultation, breath sounds equal bilaterally, no respiratory distress Cardio:  regular rate  and rhythm, no LE edema or bruits Rectal: Deferred GI: BS+, S, NT, ND, no masses or organomegaly Musculoskeletal:  symmetrical muscle groups noted without atrophy or deformity No deformity, edema, erythema over either foot. No ttp over MTP's of feet, though he notes that is where his pain is during the night.  Neuro:  gait normal; deep tendon reflexes normal and symmetric Psych: well oriented with normal range of affect and appropriate judgment/insight  Assessment and Plan  Well adult exam  Essential hypertension - Plan: CBC, Comprehensive metabolic panel, Lipid panel  Bilateral foot pain - Plan: Uric acid  Benign prostatic hyperplasia with nocturia  Increased bowel frequency   Well 82 y.o. male. Counseled on diet and  exercise. Advanced directive form provided today.  B/l foot pain: Metatarsal pads, ck urate. Consider podiatry vs sports med referral if no better in 3-4 weeks.  Urinary freq: offered medication, he politely declined.  Bowel freq: Trial fiber supplement for 3-4 weeks. If no improvement, he will let me know. Other orders as above. Follow up in 6 mo.  The patient, thru the interpreter, voiced understanding and agreement to the plan.  Timothy Roche [place], DO 07/05/23 8:43 AM

## 2023-09-07 ENCOUNTER — Ambulatory Visit (INDEPENDENT_AMBULATORY_CARE_PROVIDER_SITE_OTHER): Payer: Medicare Other | Admitting: Family Medicine

## 2023-09-07 ENCOUNTER — Encounter: Payer: Self-pay | Admitting: Family Medicine

## 2023-09-07 VITALS — BP 130/78 | HR 61 | Temp 97.5°F | Resp 16 | Ht 63.0 in | Wt 138.6 lb

## 2023-09-07 DIAGNOSIS — H8112 Benign paroxysmal vertigo, left ear: Secondary | ICD-10-CM | POA: Diagnosis not present

## 2023-09-07 DIAGNOSIS — M6281 Muscle weakness (generalized): Secondary | ICD-10-CM | POA: Diagnosis not present

## 2023-09-07 NOTE — Patient Instructions (Addendum)
Stay hydrated.  Get up slowly.  YouTube "Epley Maneuver" to get more information.   If you do not hear anything about your referral in the next 1-2 weeks, call our office and ask for an update.  Let us know if you need anything.  Quadriceps Strain Rehab It is normal to feel mild stretching, pulling, tightness, or discomfort as you do these exercises, but you should stop right away if you feel sudden pain or your pain gets worse. Stretching and range of motion exercises These exercises warm up your muscles and joints and improve the movement and flexibility of your thigh. These exercises can also help to relieve stiffness or swelling. Exercise A: Heel slides    Lie on your back with both knees straight. If this causes back discomfort, bend the knee of your healthy leg, placing your foot flat on the floor. Slowly slide your left / right heel back toward your buttocks until you feel a gentle stretch in the front of your knee or thigh. Hold for 30 seconds. Then slowly slide your heel back to the starting position. Repeat 2 times. Complete this exercise 3 times a week. Exercise B: Quadriceps stretch, prone    Lie on your abdomen on a firm surface, such as a bed or padded floor. Bend your left / right knee and hold your ankle. If you cannot reach your ankle or pant leg, loop a belt around your foot and grab the belt instead. Gently pull your heel toward your buttocks. Your knee should not slide out to the side. You should feel a stretch in the front of your thigh and knee. Hold this position for 30 seconds. Repeat 2 times. Complete this exercise 3 times a week. Strengthening exercises These exercises build strength and endurance in your thigh. Endurance is the ability to use your muscles for a long time, even after your muscles get tired. Exercise C: Straight leg raises (quadriceps and hip flexors) Quality counts! Watch for signs that the quadriceps muscle is working to ensure that you are  strengthening the correct muscles and not cheating by using healthier muscles. Lie on your back with your left / right leg extended and your other knee bent. Tense the muscles in the front of your left / right thigh. You should see your kneecap slide up or see increased dimpling just above the knee. Tighten these muscles even more and raise your leg 4-6 inches (10-15 cm) off the floor. Hold for 3 seconds. Keep the thigh muscles tense as you lower your leg. Relax the muscles slowly and completely after each repetition. Repeat 2 times. Complete this exercise 3 times a week. Exercise D: Straight leg raises (hip extensors) Lie on your belly on a bed or a firm surface with a pillow under your hips. Bend your left / right knee so your foot is straight up in the air. Tense your buttock muscles and lift your left / right thigh off the bed. Do not let your back arch. Hold this position for 3 seconds. Slowly return to the starting position. Let your muscles relax completely before doing another repetition. Repeat 2 times. Complete this exercise 3 times a week. Exercise E: Wall sits    Follow the directions for form closely. If you do not place your feet and knees properly, this can lead to knee pain. Lean back against a smooth wall or door and walk your feet out 18-24 inches (46-61 cm) from it. Place your feet hip-width apart. Slowly slide down  the wall or door until your knees bend  60-90 degrees. Keep your weight back and over your heels, not over your toes. Keep your thighs straight or pointing slightly outward. Hold for 1 second. Use your thigh and buttock muscles to push you back up to a standing position. Keep your weight through your heels while you do this. Rest for 5 seconds in between repetitions. Repeat 2 times. Complete this exercise 3 times a week. Make sure you discuss any questions you have with your health care provider. Document Released: 10/26/2005 Document Revised: 07/02/2016  Document Reviewed: 07/30/2015 Elsevier Interactive Patient Education  [employer].

## 2023-09-07 NOTE — Progress Notes (Signed)
Chief Complaint  Patient presents with   Dizziness    Discuss dizziness    Timothy Blankenship is 82 y.o. pt here for dizziness.  He is here with the aid of a [name] video interpreter.  Duration: 1 day Lasts for around 5-7 min.  Laying down, head movement or walking seem to trigger it.  N/V/D? No Pass out? No Spinning? Yes Recent illness/fever? No Headache? No Neurologic signs? No other than L thigh weakness for the past week Change in PO intake? No Palpitations?  No Illicit drug use/alcohol?  No  Past Medical History:  Diagnosis Date   High cholesterol    Hypertension    Vascular abnormality    Treated    Family History  Problem Relation Age of Onset   Healthy Mother    Healthy Father    Healthy Brother        x5   Healthy Sister        x1   Healthy Son        x2   Healthy Daughter        x1   Colon cancer Neg Hx    Esophageal cancer Neg Hx    Rectal cancer Neg Hx    Stomach cancer Neg Hx     Allergies as of 09/07/2023   No Known Allergies      Medication List        Accurate as of September 07, 2023  8:24 AM. If you have any questions, ask your nurse or doctor.          Fish Oil 1200 MG Caps Take 1,200 mg by mouth daily.   hydrochlorothiazide 12.5 MG tablet Commonly known as: HYDRODIURIL TAKE 1 TABLET(12.5 MG) BY MOUTH DAILY   losartan 100 MG tablet Commonly known as: COZAAR TAKE 1 TABLET(100 MG) BY MOUTH DAILY   multivitamin tablet Take 1 tablet by mouth daily.   rosuvastatin 40 MG tablet Commonly known as: CRESTOR Take 1 tablet (40 mg total) by mouth daily.        BP 130/78 (BP Location: Left Arm, Patient Position: Sitting, Cuff Size: Normal)   Pulse 61   Temp (!) 97.5 F (36.4 C) (Oral)   Resp 16   Ht 5\' 3"  (1.6 m)   Wt 138 lb 9.6 oz (62.9 kg)   SpO2 96%   BMI 24.55 kg/m  General: Awake, alert, appears stated age Eyes: PERRLA, EOMi Ears: Patent, TM's neg b/l Heart: RRR, no murmurs, no carotid bruits Lungs: CTAB, no  accessory muscle use MSK: No ttp over L quad Neuro: No cerebellar signs, patellar reflex 3/4 b/l wo clonus, calcaneal reflex 0/4 b/l wo clonus, biceps reflex 1/4 b/l wo clonus; Dix-Hall-Pike positive on L. 4/5 knee extension on L, 5/5 strength throughout otherwise Psych: Age appropriate judgment and insight, normal mood and affect  Benign paroxysmal positional vertigo of left ear - Plan: Ambulatory referral to Physical Therapy  Weakness of left quadriceps muscle  Refer vest rehab given language barrier. Epley maneuvers provided. Stay hydrated. Get up slowly/cautiously.  Doubt stroke. Cont Crestor. Will have him rehab the quad. Exercises provided. Send message if no better.  F/u prn. Pt, thru the interpreter, voiced understanding and agreement to the plan.  I spent 32 min w the pt discussing the above plans in addition to reviewing his chart on the same day of the visit.   Jilda Roche [place], DO 09/07/23 8:24 AM

## 2023-09-13 ENCOUNTER — Other Ambulatory Visit: Payer: Self-pay | Admitting: Family Medicine

## 2023-09-14 ENCOUNTER — Other Ambulatory Visit: Payer: Self-pay

## 2023-09-14 ENCOUNTER — Ambulatory Visit: Payer: Medicare Other | Attending: Family Medicine

## 2023-09-14 DIAGNOSIS — H8112 Benign paroxysmal vertigo, left ear: Secondary | ICD-10-CM | POA: Diagnosis not present

## 2023-09-14 DIAGNOSIS — R42 Dizziness and giddiness: Secondary | ICD-10-CM | POA: Diagnosis not present

## 2023-09-14 NOTE — Therapy (Signed)
OUTPATIENT PHYSICAL THERAPY VESTIBULAR EVALUATION     Patient Name: Timothy Blankenship MRN: [id number] DOB:Aug 06, 1941, 82 y.o., male Today's Date: 09/14/2023  END OF SESSION:  PT End of Session - 09/14/23 1636     Visit Number 1    Date for PT Re-Evaluation 11/09/23    Progress Note Due on Visit 10    PT Start Time 1102    PT Stop Time 1150    PT Time Calculation (min) 48 min    Activity Tolerance Patient tolerated treatment well    Behavior During Therapy [facility] for tasks assessed/performed             Past Medical History:  Diagnosis Date   High cholesterol    Hypertension    Vascular abnormality    Treated   Past Surgical History:  Procedure Laterality Date   TOOTH EXTRACTION     Full Dentures   Patient Active Problem List   Diagnosis Date Noted   Bilateral foot pain 07/05/2023   Benign prostatic hyperplasia with nocturia 07/05/2023   Cough 07/22/2021   Adhesive capsulitis of left shoulder 02/17/2021   Sebaceous cyst 07/03/2016   Mixed hyperlipidemia 11/25/2015   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 10/25/2015   Hereditary and idiopathic peripheral neuropathy 10/25/2015   Visit for preventive health examination 10/25/2015   Constipation 05/28/2015   Cataracts, bilateral 09/11/2014   Screening for prostate cancer 09/11/2014   Screening for ischemic heart disease 09/11/2014   Essential hypertension 08/27/2014    PCP: Arva Chafe, DO REFERRING PROVIDER: same  REFERRING DIAG: vertigo  THERAPY DIAG:  BPPV (benign paroxysmal positional vertigo), left  ONSET DATE: 09/03/23  Rationale for Evaluation and Treatment: Rehabilitation  SUBJECTIVE:   SUBJECTIVE STATEMENT: Sudden onset of vertigo occurring primarily when he gets out of bed.  Saw PCP referred for PT evaluation Pt accompanied by: family member  PERTINENT HISTORY: sudden onset vertigo 1 week ago,saw MD ruled out sinister, central pathology, referred for peripheral vertigo  PAIN:   Are you having pain? No  PRECAUTIONS: None  RED FLAGS: None   WEIGHT BEARING RESTRICTIONS: No  FALLS: Has patient fallen in last 6 months? No  LIVING ENVIRONMENT: Lives with: lives with their family and lives with their daughter Lives in: House/apartment Stairs: one flight upstairs Has following equipment at home: None  PLOF: Independent  PATIENT GOALS: resolve vertigo  OBJECTIVE:  Note: Objective measures were completed at Evaluation unless otherwise noted.  DIAGNOSTIC FINDINGS: na  COGNITION: Overall cognitive status: Within functional limits for tasks assessed   SENSATION: WFL  EDEMA:  None noted  MUSCLE TONE: wfl   POSTURE:  No Significant postural limitations  Cervical ROM:    Active A/PROM (deg) eval  Flexion 65%  Extension 65%  Right lateral flexion   Left lateral flexion   Right rotation 75  Left rotation 75  (Blank rows = not tested)  STRENGTH: UE strength wfl  LOWER EXTREMITY MMT: all wfl , B quads wfl  BED MOBILITY: I but CGA once sitting due to c/o dizziness   TRANSFERS: I without device  GAIT: I gait without device, no weaving or deficits noted  FUNCTIONAL TESTS:  TBD  PATIENT SURVEYS:  DHI 38  VESTIBULAR ASSESSMENT:  GENERAL OBSERVATION: healthy elderly male, moves slowly , guarded with bed mobility   SYMPTOM BEHAVIOR:  Subjective history: 1 week sudden onset  Non-Vestibular symptoms:  na  Type of dizziness: Spinning/Vertigo and "World moves"  Frequency: daily multiple times  Duration: a  few sec to a few min  Aggravating factors: Induced by position change: supine to sit and Induced by motion: bending down to the ground  Relieving factors: head stationary  Progression of symptoms: unchanged  OCULOMOTOR EXAM:  Ocular Alignment: normal  Ocular ROM: No Limitations  Spontaneous Nystagmus: absent  Gaze-Induced Nystagmus: absent  Smooth Pursuits: saccades  Saccades: hypometric/undershoots  Convergence/Divergence: NT      VESTIBULAR - OCULAR REFLEX:   Slow VOR: Normal  VOR Cancellation: Normal  Head-Impulse Test: HIT Right: negative  Dynamic Visual Acuity: Not able to be assessed   POSITIONAL TESTING: Right Dix-Hallpike: no nystagmus Left Dix-Hallpike: no nystagmus and reports room spinning in first 5 sec  Right Roll Test: no nystagmus Left Roll Test: no nystagmus  MOTION SENSITIVITY:  Motion Sensitivity Quotient Intensity: 0 = none, 1 = Lightheaded, 2 = Mild, 3 = Moderate, 4 = Severe, 5 = Vomiting  Intensity  1. Sitting to supine   2. Supine to L side   3. Supine to R side   4. Supine to sitting   5. L Hallpike-Dix   6. Up from L    7. R Hallpike-Dix   8. Up from R    9. Sitting, head tipped to L knee   10. Head up from L knee   11. Sitting, head tipped to R knee   12. Head up from R knee   13. Sitting head turns x5   14.Sitting head nods x5   15. In stance, 180 turn to L    16. In stance, 180 turn to R     OTHOSTATICS: not done  FUNCTIONAL GAIT: TBA    VESTIBULAR TREATMENT:                                                                                                   DATE: 09/14/23  Canalith Repositioning:  Epley Left: Number of Reps: 3, Sx resolved after 3rd maneuver and retest PATIENT EDUCATION: Education details: POC, goals Person educated: Patient and Child(ren) Education method: Explanation, Demonstration, Tactile cues, and Verbal cues Education comprehension: verbalized understanding, returned demonstration, verbal cues required, and tactile cues required  HOME EXERCISE PROGRAM:  GOALS: Goals reviewed with patient? Yes  SHORT TERM GOALS: Target date: 2 weeks, 09/28/23  I with habituation, adaptation, self Rx for BPPV Baseline:initial Goal status: INITIAL   LONG TERM GOALS: Target date: 11/09/23  DHI 16 or less Baseline: 38 Goal status: INITIAL  2.  FGA 28/30 Baseline: TBD Goal status: INITIAL  3.  Resolution of positional vertigo Baseline:   Goal status: INITIAL   ASSESSMENT:  CLINICAL IMPRESSION: Patient is a 82 y.o. male who was evaluated today by skilled physical therapy to address peripheral vertigo.  Evaluation with positional testing revealed provocation of Sx with L posterior canal testing. Progressed through 3 repeated Epley maneuvers to no Sx by final test.  Recommended to he and his daughter return appts once a week to reassess as needed. Language barrier with both pt and daughter, utilized his son's assistance on speaker phone as well, which made evaluation a little more  complicated and prolonged.  Should benefit from skilled PT for BPPV .   OBJECTIVE IMPAIRMENTS: decreased balance, decreased knowledge of condition, and decreased mobility.   ACTIVITY LIMITATIONS: bending, sleeping, stairs, and locomotion level  PARTICIPATION LIMITATIONS: cleaning, laundry, shopping, community activity, and yard work  PERSONAL FACTORS: Age, Behavior pattern, Past/current experiences, Transportation, and 1-2 comorbidities: HTN  are also affecting patient's functional outcome.   REHAB POTENTIAL: Good  CLINICAL DECISION MAKING: Evolving/moderate complexity  EVALUATION COMPLEXITY: Moderate   PLAN:  PT FREQUENCY: 1x/week  PT DURATION: 6 weeks  PLANNED INTERVENTIONS: 97110-Therapeutic exercises, 97530- Therapeutic activity, [id number]- Neuromuscular re-education, 97535- Self Care, 16109- Manual therapy, 95992- Canalith repositioning, and Vestibular training  PLAN FOR NEXT SESSION: recheck for BPPV, inst in home routine   Lowen Mansouri L Moiz Ry[place], PT 09/14/2023, 4:45 PM

## 2023-10-01 ENCOUNTER — Ambulatory Visit: Payer: Medicare Other | Admitting: Physical Therapy

## 2023-10-05 ENCOUNTER — Encounter: Payer: Medicare Other | Admitting: Physical Therapy

## 2023-11-30 ENCOUNTER — Other Ambulatory Visit: Payer: Self-pay | Admitting: Family Medicine

## 2023-11-30 DIAGNOSIS — I10 Essential (primary) hypertension: Secondary | ICD-10-CM

## 2023-12-01 ENCOUNTER — Other Ambulatory Visit: Payer: Self-pay

## 2023-12-01 DIAGNOSIS — I10 Essential (primary) hypertension: Secondary | ICD-10-CM

## 2023-12-01 MED ORDER — HYDROCHLOROTHIAZIDE 12.5 MG PO TABS: ORAL_TABLET | ORAL | 2 refills | Status: DC

## 2024-01-05 ENCOUNTER — Encounter: Payer: Self-pay | Admitting: Family Medicine

## 2024-01-05 ENCOUNTER — Other Ambulatory Visit: Payer: Self-pay

## 2024-01-05 ENCOUNTER — Ambulatory Visit (INDEPENDENT_AMBULATORY_CARE_PROVIDER_SITE_OTHER): Payer: Medicare Other | Admitting: Family Medicine

## 2024-01-05 VITALS — BP 126/74 | HR 71 | Temp 98.0°F | Resp 16 | Ht 63.0 in | Wt 142.8 lb

## 2024-01-05 DIAGNOSIS — R252 Cramp and spasm: Secondary | ICD-10-CM

## 2024-01-05 DIAGNOSIS — I10 Essential (primary) hypertension: Secondary | ICD-10-CM | POA: Diagnosis not present

## 2024-01-05 DIAGNOSIS — E782 Mixed hyperlipidemia: Secondary | ICD-10-CM | POA: Diagnosis not present

## 2024-01-05 LAB — LIPID PANEL
Cholesterol: 105 mg/dL (ref 0–200)
HDL: 42.3 mg/dL (ref >39.00)
LDL Cholesterol: 31 mg/dL (ref 0–99)
NonHDL: 63.04
Total CHOL/HDL Ratio: 2
Triglycerides: 161 mg/dL — ABNORMAL HIGH (ref 0.0–149.0)
VLDL: 32.2 mg/dL (ref 0.0–40.0)

## 2024-01-05 LAB — COMPREHENSIVE METABOLIC PANEL
ALT: 37 U/L (ref 0–53)
AST: 35 U/L (ref 0–37)
Albumin: 4.5 g/dL (ref 3.5–5.2)
Alkaline Phosphatase: 52 U/L (ref 39–117)
BUN: 26 mg/dL — ABNORMAL HIGH (ref 6–23)
CO2: 30 meq/L (ref 19–32)
Calcium: 9.4 mg/dL (ref 8.4–10.5)
Chloride: 102 meq/L (ref 96–112)
Creatinine, Ser: 1.1 mg/dL (ref 0.40–1.50)
GFR: 62.55 mL/min (ref >60.00)
Glucose, Bld: 91 mg/dL (ref 70–99)
Potassium: 3.7 meq/L (ref 3.5–5.1)
Sodium: 140 meq/L (ref 135–145)
Total Bilirubin: 0.6 mg/dL (ref 0.2–1.2)
Total Protein: 7.6 g/dL (ref 6.0–8.3)

## 2024-01-05 LAB — MAGNESIUM: Magnesium: 1.9 mg/dL (ref 1.5–2.5)

## 2024-01-05 LAB — IBC + FERRITIN
Ferritin: 408.2 ng/mL — ABNORMAL HIGH (ref 22.0–322.0)
Iron: 115 ug/dL (ref 42–165)
Saturation Ratios: 45.6 % (ref 20.0–50.0)
TIBC: 252 ug/dL (ref 250.0–450.0)
Transferrin: 180 mg/dL — ABNORMAL LOW (ref 212.0–360.0)

## 2024-01-05 LAB — TSH: TSH: 2.32 u[IU]/mL (ref 0.35–5.50)

## 2024-01-05 NOTE — Patient Instructions (Addendum)
 Give [place] 2-3 business days to get the results of your labs back.   Keep the diet clean and stay active.  Try to drink 55-60 oz of water daily outside of exercise.  For the muscle cramping, drink lots of fluids. Also take a spoonful of pickle juice nightly. An alternative would be a teaspoon of mustard, but most people prefer pickle juice. You could consider Vit K2.   Take Metamucil or Benefiber daily.  Consider some Selsun Blue or Head and Shoulders for dandruff.   Let us know if you need anything.

## 2024-01-05 NOTE — Progress Notes (Signed)
 Chief Complaint  Patient presents with   Follow-up    Follow up     Subjective Timothy Blankenship is a 83 y.o. male who presents for hypertension follow up.  He is here with the aid of a [name] interpreter. He does monitor home blood pressures. Blood pressures ranging from 130's/70's on average. He is compliant with medications-hydrochlorothiazide 12.5 mg daily, losartan 100 mg daily. Patient has these side effects of medication: none He is usually adhering to a healthy diet overall. Current exercise: none No CP or SOB.   Mixed hyperlipidemia Patient presents for mixed hyperlipidemia follow up. Currently being treated with Crestor 40 milligrams daily and compliance with treatment thus far has been good. He denies myalgias. Diet/exercise as above. The patient is not known to have coexisting coronary artery disease.  B/l leg cramping when he lays down to go to sleep. Has been going on for a couple. No inj or change in activity. He stays hydrated. Has not tried anything at home thus far.    Past Medical History:  Diagnosis Date   High cholesterol    Hypertension    Vascular abnormality    Treated    Exam BP 126/74 (BP Location: Left Arm, Patient Position: Sitting)   Pulse 71   Temp 98 F (36.7 C) (Oral)   Resp 16   Ht 5\' 3"  (1.6 m)   Wt 142 lb 12.8 oz (64.8 kg)   SpO2 95%   BMI 25.30 kg/m  General:  well developed, well nourished, in no apparent distress Heart: RRR, no bruits, no LE edema Lungs: clear to auscultation, no accessory muscle use Psych: well oriented with normal range of affect and appropriate judgment/insight  Essential hypertension  Mixed hyperlipidemia - Plan: Comprehensive metabolic panel, Lipid panel  Leg cramping - Plan: TSH, Magnesium, IBC + Ferritin  Chronic, stable.  Continue losartan 100 mg daily, hydrochlorothiazide 12.5 mg daily.  Counseled on diet and exercise. Chronic, stable.  Continue Crestor 40 mg daily. Ck above. Stay hydrated. Consider  pickle juice or Vit K2.  F/u in 6 months for physical, annual wellness visit at convenience. The patient, through the interpreter, voiced understanding and agreement to the plan.  Jilda Roche [place], DO 01/05/24  8:13 AM

## 2024-03-16 ENCOUNTER — Encounter: Payer: Self-pay | Admitting: Family Medicine

## 2024-03-16 ENCOUNTER — Other Ambulatory Visit: Payer: Self-pay | Admitting: Emergency Medicine

## 2024-03-17 ENCOUNTER — Other Ambulatory Visit: Payer: Self-pay | Admitting: Family Medicine

## 2024-03-17 MED ORDER — ROSUVASTATIN CALCIUM 40 MG PO TABS: 40.0000 mg | ORAL_TABLET | Freq: Every day | ORAL | 3 refills | Status: AC

## 2024-05-28 DIAGNOSIS — M1710 Unilateral primary osteoarthritis, unspecified knee: Secondary | ICD-10-CM | POA: Diagnosis not present

## 2024-05-28 DIAGNOSIS — I10 Essential (primary) hypertension: Secondary | ICD-10-CM | POA: Diagnosis not present

## 2024-07-05 ENCOUNTER — Ambulatory Visit (INDEPENDENT_AMBULATORY_CARE_PROVIDER_SITE_OTHER): Payer: Medicare Other | Admitting: Family Medicine

## 2024-07-05 ENCOUNTER — Encounter: Payer: Self-pay | Admitting: Family Medicine

## 2024-07-05 ENCOUNTER — Ambulatory Visit: Payer: Self-pay | Admitting: Family Medicine

## 2024-07-05 VITALS — BP 124/72 | HR 67 | Temp 98.0°F | Resp 16 | Ht 63.0 in | Wt 138.2 lb

## 2024-07-05 DIAGNOSIS — Z0000 Encounter for general adult medical examination without abnormal findings: Secondary | ICD-10-CM

## 2024-07-05 DIAGNOSIS — E782 Mixed hyperlipidemia: Secondary | ICD-10-CM

## 2024-07-05 DIAGNOSIS — I10 Essential (primary) hypertension: Secondary | ICD-10-CM | POA: Diagnosis not present

## 2024-07-05 DIAGNOSIS — K0889 Other specified disorders of teeth and supporting structures: Secondary | ICD-10-CM

## 2024-07-05 LAB — COMPREHENSIVE METABOLIC PANEL WITH GFR
ALT: 27 U/L (ref 0–53)
AST: 32 U/L (ref 0–37)
Albumin: 4.7 g/dL (ref 3.5–5.2)
Alkaline Phosphatase: 53 U/L (ref 39–117)
BUN: 13 mg/dL (ref 6–23)
CO2: 29 meq/L (ref 19–32)
Calcium: 9.7 mg/dL (ref 8.4–10.5)
Chloride: 102 meq/L (ref 96–112)
Creatinine, Ser: 0.97 mg/dL (ref 0.40–1.50)
GFR: 72.49 mL/min (ref >60.00)
Glucose, Bld: 98 mg/dL (ref 70–99)
Potassium: 3.9 meq/L (ref 3.5–5.1)
Sodium: 141 meq/L (ref 135–145)
Total Bilirubin: 0.7 mg/dL (ref 0.2–1.2)
Total Protein: 7.8 g/dL (ref 6.0–8.3)

## 2024-07-05 LAB — CBC
HCT: 45.1 % (ref 39.0–52.0)
Hemoglobin: 14.9 g/dL (ref 13.0–17.0)
MCHC: 33 g/dL (ref 30.0–36.0)
MCV: 87.1 fl (ref 78.0–100.0)
Platelets: 194 K/uL (ref 150.0–400.0)
RBC: 5.18 Mil/uL (ref 4.22–5.81)
RDW: 13.9 % (ref 11.5–15.5)
WBC: 6.3 K/uL (ref 4.0–10.5)

## 2024-07-05 LAB — LIPID PANEL
Cholesterol: 125 mg/dL (ref 0–200)
HDL: 41.8 mg/dL (ref >39.00)
LDL Cholesterol: 38 mg/dL (ref 0–99)
NonHDL: 83.37
Total CHOL/HDL Ratio: 3
Triglycerides: 227 mg/dL — ABNORMAL HIGH (ref 0.0–149.0)
VLDL: 45.4 mg/dL — ABNORMAL HIGH (ref 0.0–40.0)

## 2024-07-05 NOTE — Patient Instructions (Addendum)
 Give us  2-3 business days to get the results of your labs back.   Keep the diet clean and stay active.  I recommend getting the flu shot in mid October. This suggestion would change if the CDC comes out with a different recommendation.   Please get me a copy of your advanced directive form at your convenience.   Please call a dentist.   Foods that may reduce pain: 1) Ginger 2) Blueberries 3) Salmon 4) Pumpkin seeds 5) Dark chocolate 6) Turmeric 7) Tart cherries 8) Virgin olive oil 9) Chili peppers 10) Mint 11) Krill oil  Let us  know if you need anything.

## 2024-07-05 NOTE — Progress Notes (Signed)
 Chief Complaint  Patient presents with   Annual Exam    CPE    Well Male Timothy Blankenship is here for a complete physical.  Here w aid of [name] phone interpreter.  His last physical was >1 year ago.  Current diet: in general, a healthy diet.   Current exercise: limited 2/2 knee pain Weight trend: stable Fatigue out of ordinary? No. Seat belt? Yes.   Advanced directive? No  Health maintenance Shingrix- Yes Tetanus- Yes Pneumonia vaccine- Yes  Past Medical History:  Diagnosis Date   High cholesterol    Hypertension    Vascular abnormality    Treated     Past Surgical History:  Procedure Laterality Date   TOOTH EXTRACTION     Full Dentures    Medications  Current Outpatient Medications on File Prior to Visit  Medication Sig Dispense Refill   hydrochlorothiazide  (HYDRODIURIL ) 12.5 MG tablet TAKE 1 TABLET(12.5 MG) BY MOUTH DAILY 90 tablet 2   losartan  (COZAAR ) 100 MG tablet TAKE 1 TABLET(100 MG) BY MOUTH DAILY 90 tablet 2   Multiple Vitamin (MULTIVITAMIN) tablet Take 1 tablet by mouth daily.     Omega-3 Fatty Acids (FISH OIL) 1200 MG CAPS Take 1,200 mg by mouth daily.     rosuvastatin  (CRESTOR ) 40 MG tablet Take 1 tablet (40 mg total) by mouth daily. 90 tablet 3    Allergies No Known Allergies  Family History Family History  Problem Relation Age of Onset   Healthy Mother    Healthy Father    Healthy Brother        x5   Healthy Sister        x1   Healthy Son        x2   Healthy Daughter        x1   Colon cancer Neg Hx    Esophageal cancer Neg Hx    Rectal cancer Neg Hx    Stomach cancer Neg Hx     Review of Systems: Constitutional:  no fevers Eye:  no recent significant change in vision Ears:  No changes in hearing Nose/Mouth/Throat:  no complaints of nasal congestion, no sore throat Cardiovascular: no chest pain Respiratory:  No shortness of breath Gastrointestinal:  No change in bowel habits GU:  No frequency, +intermittent daytime/nighttime  incontinence Integumentary:  no abnormal skin lesions reported Neurologic:  no headaches Endocrine:  denies unexplained weight changes  Exam BP 124/72 (BP Location: Left Arm, Patient Position: Sitting)   Pulse 67   Temp 98 F (36.7 C) (Oral)   Resp 16   Ht 5' 3 (1.6 m)   Wt 138 lb 3.2 oz (62.7 kg)   SpO2 95%   BMI 24.48 kg/m  General:  well developed, well nourished, in no apparent distress Skin:  no significant moles, warts, or growths Head:  no masses, lesions, or tenderness Eyes:  pupils equal and round, sclera anicteric without injection Ears:  canals without lesions, TMs shiny without retraction, no obvious effusion, no erythema Nose:  nares patent, mucosa normal Throat/Pharynx:  lips and gingiva without lesion; tongue and uvula midline; non-inflamed pharynx; no exudates or postnasal drainage Lungs:  clear to auscultation, breath sounds equal bilaterally, no respiratory distress Cardio:  regular rate and rhythm, no LE edema or bruits Rectal: Deferred GI: BS+, S, NT, ND, no masses or organomegaly Musculoskeletal:  symmetrical muscle groups noted without atrophy or deformity Neuro:  gait normal; deep tendon reflexes normal and symmetric Psych: well oriented with normal range of  affect and appropriate judgment/insight  Assessment and Plan  Well adult exam  Essential hypertension - Plan: CBC, Comprehensive metabolic panel with GFR, Lipid panel   Well 83 y.o. male. Counseled on diet and exercise. Advanced directive form provided today.  Flu shot rec'd for mid Oct.  Probably has BPH vs OAB. He politely declined any medicine to address this.  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  Needs to see a dentist. Other orders as above. Follow up in 6 mo. Sched AWV at [facility].  The patient, thru the interpreter, voiced understanding and agreement to the plan.  Mabel [place], DO 07/05/24 8:24 AM

## 2024-09-03 ENCOUNTER — Other Ambulatory Visit: Payer: Self-pay | Admitting: Family Medicine

## 2024-09-03 DIAGNOSIS — I10 Essential (primary) hypertension: Secondary | ICD-10-CM

## 2024-10-26 ENCOUNTER — Other Ambulatory Visit: Payer: Self-pay

## 2024-10-26 DIAGNOSIS — I10 Essential (primary) hypertension: Secondary | ICD-10-CM

## 2024-10-26 MED ORDER — LOSARTAN POTASSIUM 100 MG PO TABS: 100.0000 mg | ORAL_TABLET | Freq: Every day | ORAL | 1 refills | Status: AC

## 2025-01-03 ENCOUNTER — Ambulatory Visit: Admitting: Family Medicine
